# Patient Record
Sex: Male | Born: 1950 | Race: White | Hispanic: No | Marital: Married | Smoking: Never smoker
Health system: Southern US, Community
[De-identification: ages and names within clinical notes are randomized; demographics above are authoritative.]

## PROBLEM LIST (undated history)

## (undated) DIAGNOSIS — F32A Depression, unspecified: Secondary | ICD-10-CM

## (undated) DIAGNOSIS — Z87442 Personal history of urinary calculi: Secondary | ICD-10-CM

## (undated) DIAGNOSIS — I639 Cerebral infarction, unspecified: Secondary | ICD-10-CM

## (undated) DIAGNOSIS — F329 Major depressive disorder, single episode, unspecified: Secondary | ICD-10-CM

## (undated) DIAGNOSIS — N200 Calculus of kidney: Secondary | ICD-10-CM

## (undated) DIAGNOSIS — G4730 Sleep apnea, unspecified: Secondary | ICD-10-CM

## (undated) HISTORY — PX: CYSTOSCOPY: SUR368

## (undated) HISTORY — PX: LITHOTRIPSY: SUR834

---

## 2004-03-18 ENCOUNTER — Other Ambulatory Visit: Payer: Self-pay

## 2005-09-01 ENCOUNTER — Ambulatory Visit: Payer: Self-pay | Admitting: Unknown Physician Specialty

## 2007-07-05 ENCOUNTER — Ambulatory Visit: Payer: Self-pay | Admitting: Internal Medicine

## 2008-04-10 ENCOUNTER — Observation Stay: Payer: Self-pay | Admitting: Internal Medicine

## 2009-06-25 IMAGING — CT CT STONE STUDY
1 of 2 series · 16 of 32 positions shown, 20 images · non-contrast
Comparison: none

REASON FOR EXAM: flank pain l
COMMENTS:

[Series 2: stone · axial · 0.68mm/px · z∈[-304,+128]mm · 16 of 156 slices shown, 20 images]
[im 6/156  soft-tissue]
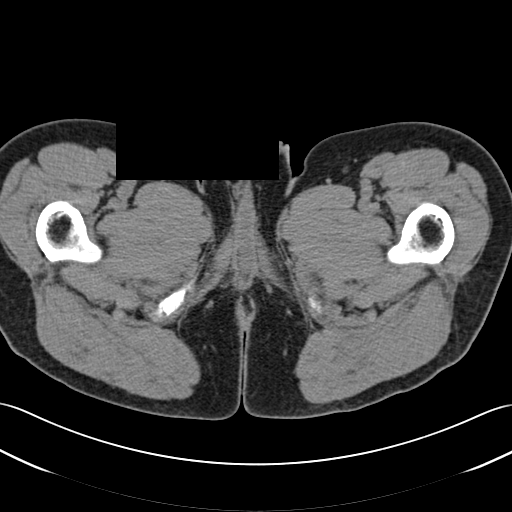
[im 6/156  bone]
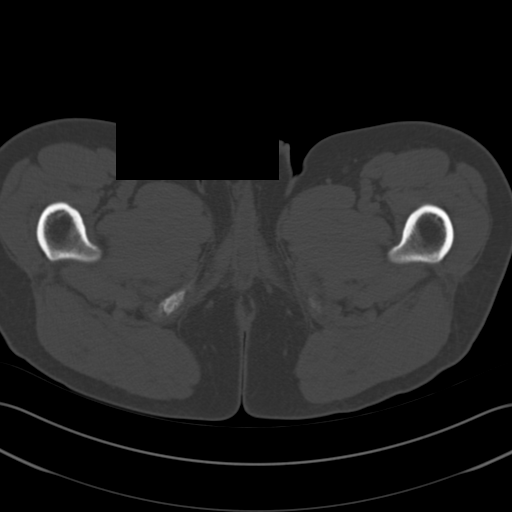
[im 18/156  soft-tissue]
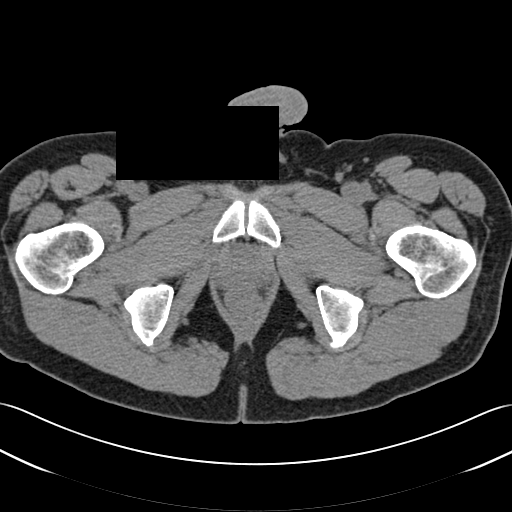
[im 30/156  soft-tissue]
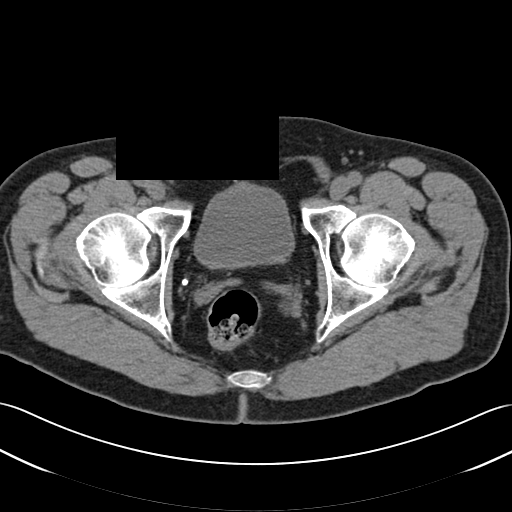
[im 42/156  soft-tissue]
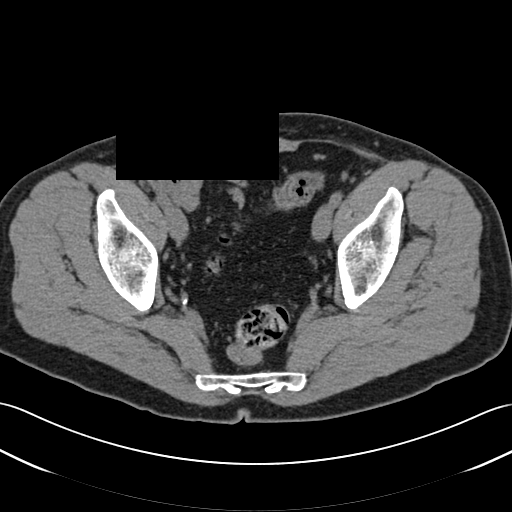
[im 54/156  soft-tissue]
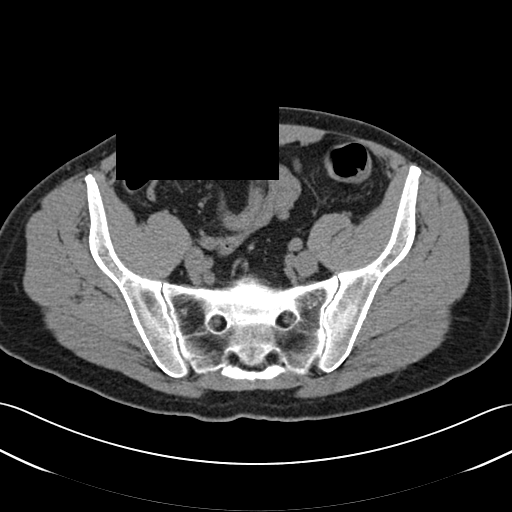
[im 60/156  soft-tissue]
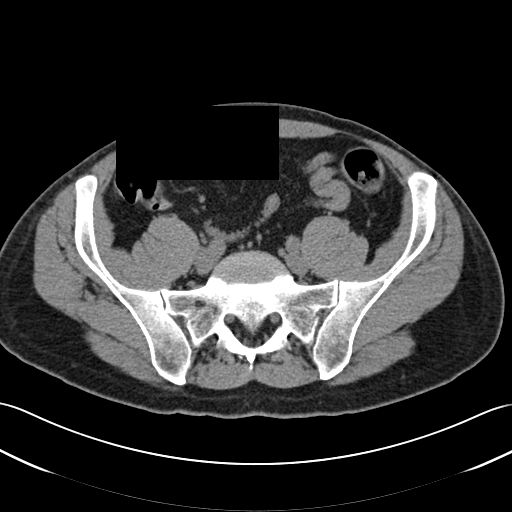
[im 72/156  soft-tissue]
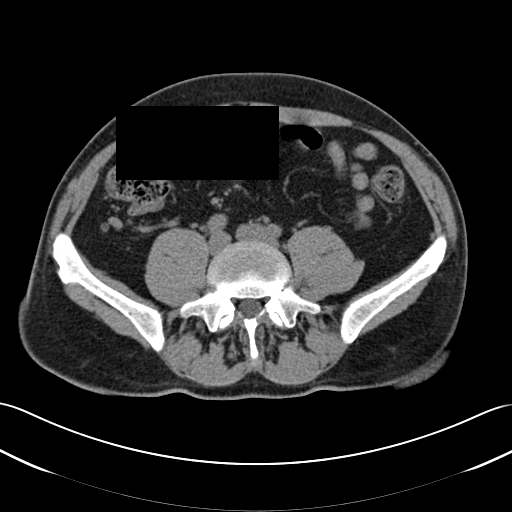
[im 84/156  soft-tissue]
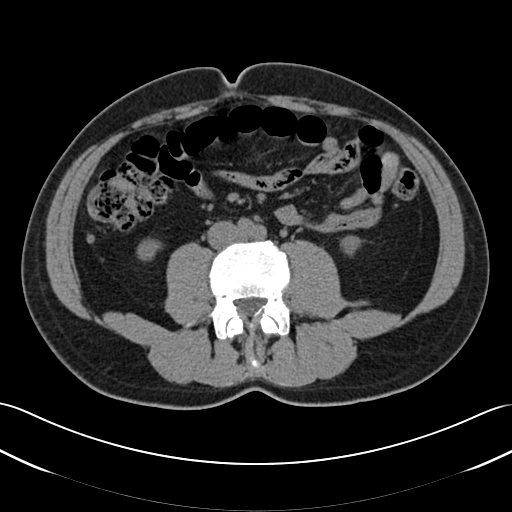
[im 96/156  soft-tissue]
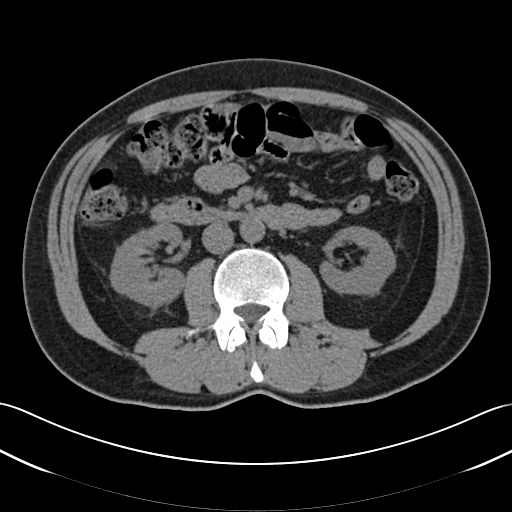
[im 96/156  bone]
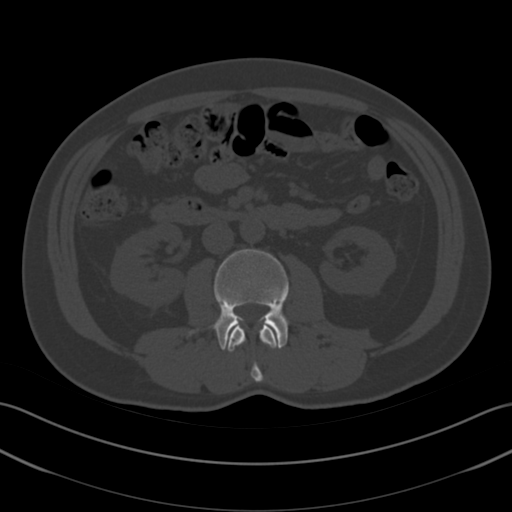
[im 102/156  soft-tissue]
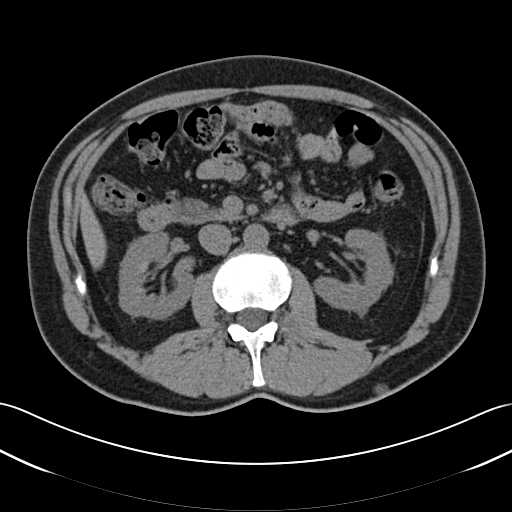
[im 114/156  soft-tissue]
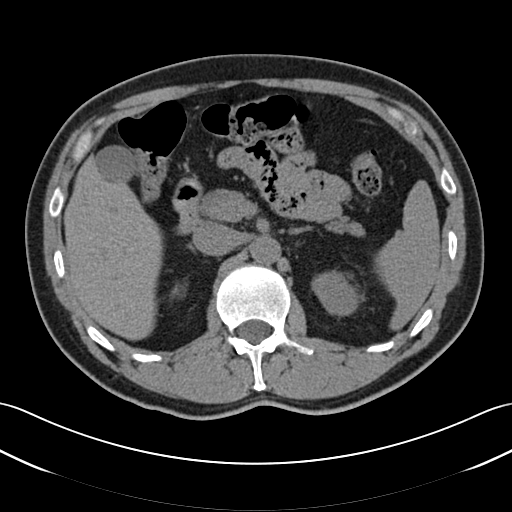
[im 126/156  soft-tissue]
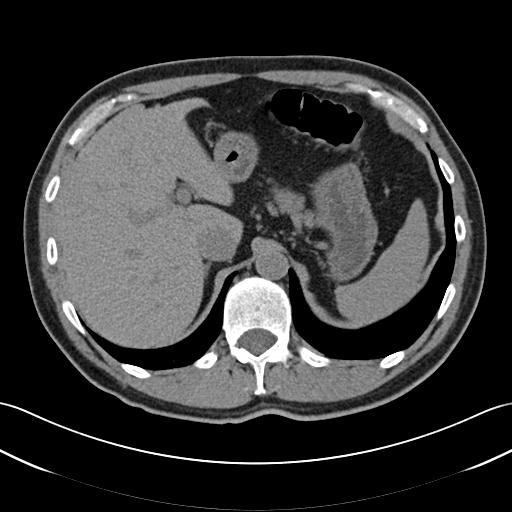
[im 132/156  lung]
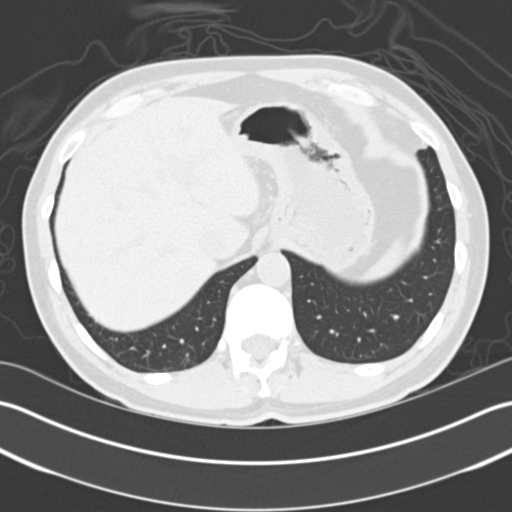
[im 138/156  soft-tissue]
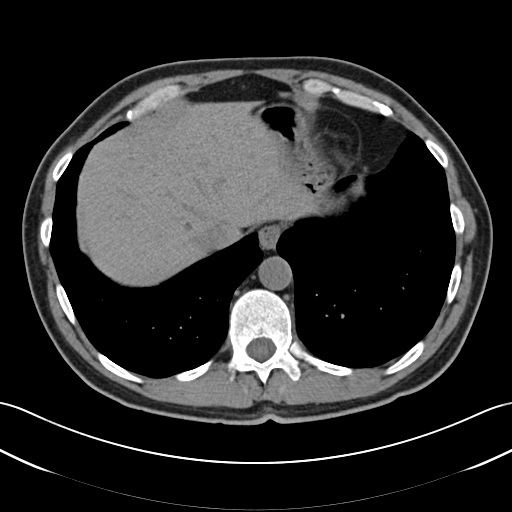
[im 138/156  lung]
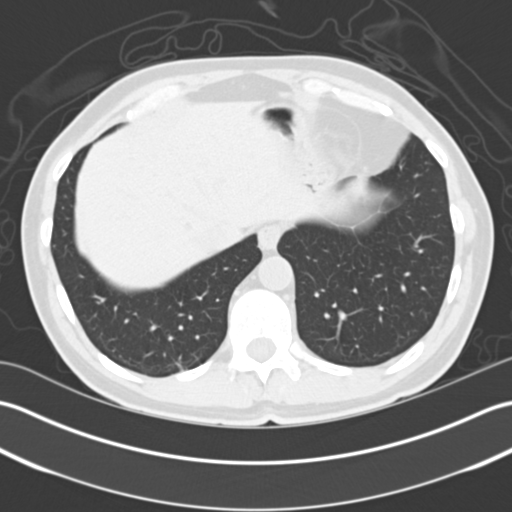
[im 144/156  lung]
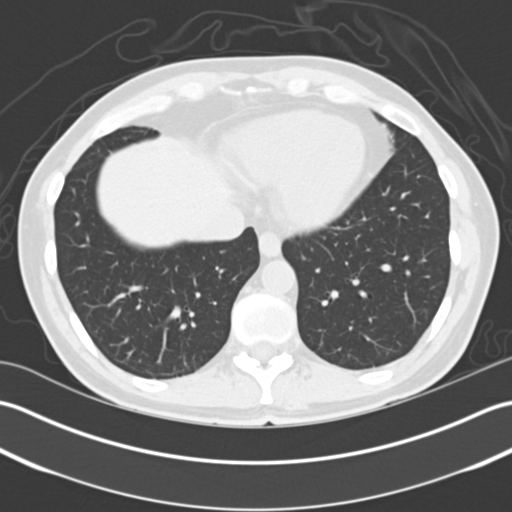
[im 150/156  soft-tissue]
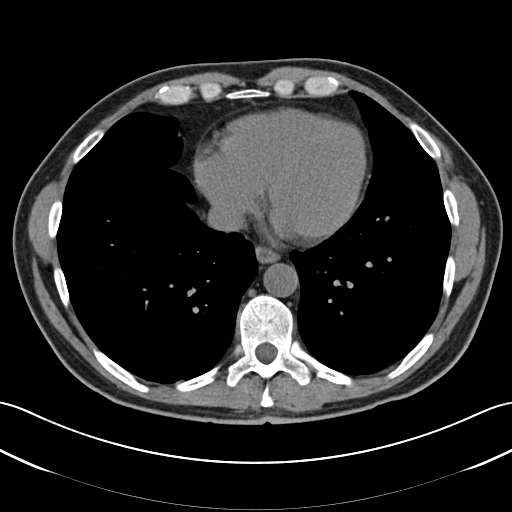
[im 150/156  lung]
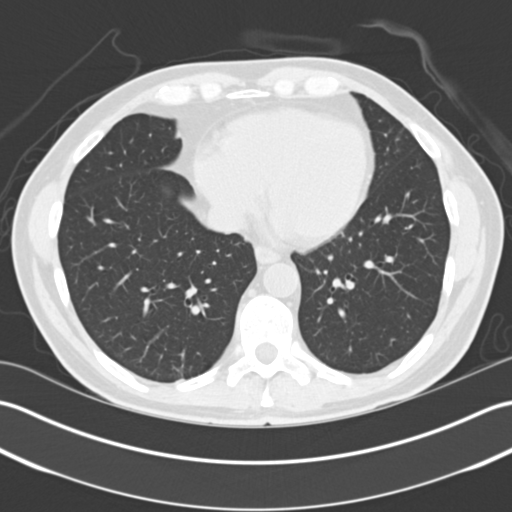

[16 of 32 positions shown; findings below may reference images not displayed]

PROCEDURE:     CT  - CT ABDOMEN /PELVIS WO (STONE)  - April 10, 2008 [DATE]

RESULT:     Noncontrast emergent CT of the abdomen and pelvis is performed
in the standard fashion. There is no previous examination for comparison.

The lung bases are clear.

The kidneys demonstrate no obstructive change. No urinary calculi are
evident. The upper abdominal viscera appear to be normal. The bowel shows no
abnormal distention. There is no inflammatory change. The appendix appears
normal. There is no ascites or free air. The abdominal aorta is normal in
caliber.
IMPRESSION: 1. No urinary obstruction or stones.
2. No other significant abnormality.

## 2010-01-15 ENCOUNTER — Emergency Department: Payer: Self-pay | Admitting: Emergency Medicine

## 2011-10-30 ENCOUNTER — Ambulatory Visit: Payer: Self-pay | Admitting: Unknown Physician Specialty

## 2011-11-28 ENCOUNTER — Ambulatory Visit: Payer: Self-pay | Admitting: Unknown Physician Specialty

## 2015-09-03 DIAGNOSIS — I639 Cerebral infarction, unspecified: Secondary | ICD-10-CM | POA: Insufficient documentation

## 2015-11-07 DIAGNOSIS — N200 Calculus of kidney: Secondary | ICD-10-CM | POA: Insufficient documentation

## 2017-03-06 ENCOUNTER — Encounter: Payer: Self-pay | Admitting: Emergency Medicine

## 2017-03-06 ENCOUNTER — Emergency Department
Admission: EM | Admit: 2017-03-06 | Discharge: 2017-03-06 | Disposition: A | Discharge status: Home / Self Care | Payer: Medicare Other | Attending: Emergency Medicine | Admitting: Emergency Medicine

## 2017-03-06 DIAGNOSIS — W268XXA Contact with other sharp object(s), not elsewhere classified, initial encounter: Secondary | ICD-10-CM | POA: Diagnosis not present

## 2017-03-06 DIAGNOSIS — Y939 Activity, unspecified: Secondary | ICD-10-CM | POA: Insufficient documentation

## 2017-03-06 DIAGNOSIS — Y999 Unspecified external cause status: Secondary | ICD-10-CM | POA: Insufficient documentation

## 2017-03-06 DIAGNOSIS — Z79899 Other long term (current) drug therapy: Secondary | ICD-10-CM | POA: Insufficient documentation

## 2017-03-06 DIAGNOSIS — Y929 Unspecified place or not applicable: Secondary | ICD-10-CM | POA: Insufficient documentation

## 2017-03-06 DIAGNOSIS — Z8673 Personal history of transient ischemic attack (TIA), and cerebral infarction without residual deficits: Secondary | ICD-10-CM | POA: Insufficient documentation

## 2017-03-06 DIAGNOSIS — S41112A Laceration without foreign body of left upper arm, initial encounter: Secondary | ICD-10-CM | POA: Insufficient documentation

## 2017-03-06 DIAGNOSIS — Z7982 Long term (current) use of aspirin: Secondary | ICD-10-CM | POA: Diagnosis not present

## 2017-03-06 HISTORY — DX: Calculus of kidney: N20.0

## 2017-03-06 HISTORY — DX: Major depressive disorder, single episode, unspecified: F32.9

## 2017-03-06 HISTORY — DX: Depression, unspecified: F32.A

## 2017-03-06 HISTORY — DX: Cerebral infarction, unspecified: I63.9

## 2017-03-06 MED ORDER — LIDOCAINE HCL (PF) 1 % IJ SOLN
5.0000 mL | Freq: Once | INTRAMUSCULAR | Status: AC
  Administered 2017-03-06: 5 mL via INTRADERMAL
  Filled 2017-03-06: qty 5

## 2017-03-06 MED ORDER — CEPHALEXIN 500 MG PO CAPS: 500.0000 mg | ORAL_CAPSULE | Freq: Four times a day (QID) | ORAL | 0 refills | Status: AC

## 2017-03-06 MED ORDER — TETANUS-DIPHTH-ACELL PERTUSSIS 5-2.5-18.5 LF-MCG/0.5 IM SUSP
0.5000 mL | Freq: Once | INTRAMUSCULAR | Status: AC
  Administered 2017-03-06: 0.5 mL via INTRAMUSCULAR
  Filled 2017-03-06: qty 0.5

## 2017-03-06 NOTE — ED Notes (Signed)
Pt discharged to home.  Family member driving.  Discharge instructions reviewed.  Verbalized understanding.  No questions or concerns at this time.  Teach back verified.  Pt in NAD.  No items left in ED.   

## 2017-03-06 NOTE — ED Notes (Signed)
Wound cleaned and dressed by this RN.  Pt in NAD.

## 2017-03-06 NOTE — ED Provider Notes (Signed)
[facility] Emergency Department Provider Note  ____________________________________________  Time seen: Approximately 12:45 PM  I have reviewed the triage vital signs and the nursing notes.   HISTORY  Chief Complaint Laceration    HPI Thomas Olson is a 66 y.o. male that presents to emergency department with left forearm laceration. Patient cut himself on a piece of aluminum. He is able to move his hand fully. He is not concerned for any metal in his forearm. He does not recall last tetanus shot.No additional injuries. No numbness, tingling.   Past Medical History:  Diagnosis Date  . CVA (cerebral vascular accident) (HCC)   . Depression   . Kidney stones     There are no active problems to display for this patient.   History reviewed. No pertinent surgical history.  Prior to Admission medications   Medication Sig Start Date End Date Taking? Authorizing Provider  aspirin EC 81 MG tablet Take 81 mg by mouth daily.   Yes [provider]  atorvastatin (LIPITOR) 20 MG tablet Take 20 mg by mouth daily.   Yes [provider]  citalopram (CELEXA) 20 MG tablet Take 20 mg by mouth daily.   Yes [provider]  cephALEXin (KEFLEX) 500 MG capsule Take 1 capsule (500 mg total) by mouth 4 (four) times daily. 03/06/17 03/16/17  Enid Derry, PA-C    Allergies Dilaudid [hydromorphone hcl]  No family history on file.  Social History Social History  Substance Use Topics  . Smoking status: Never Smoker  . Smokeless tobacco: Never Used  . Alcohol use No     Review of Systems  Constitutional: No fever/chills Cardiovascular: No chest pain. Respiratory: No SOB. Gastrointestinal: No nausea, no vomiting.  Skin: Negative for rash, ecchymosis.   ____________________________________________   PHYSICAL EXAM:  VITAL SIGNS: ED Triage Vitals  Enc Vitals Group     BP 03/06/17 1128 124/69     Pulse Rate 03/06/17 1128 71   Resp 03/06/17 1128 16     Temp 03/06/17 1128 98.5 F (36.9 C)     Temp Source 03/06/17 1128 Oral     SpO2 03/06/17 1128 98 %     Weight 03/06/17 1127 165 lb (74.8 kg)     Height 03/06/17 1127  (1.702 m)     Head Circumference --      Peak Flow --      Pain Score 03/06/17 1127 6     Pain Loc --      Pain Edu? --      Excl. in GC? --      Constitutional: Alert and oriented. Well appearing and in no acute distress. Eyes: Conjunctivae are normal. PERRL. EOMI. Head: Atraumatic. ENT:      Ears:      Nose: No congestion/rhinnorhea.      Mouth/Throat: Mucous membranes are moist.  Neck: No stridor. Cardiovascular: Normal rate, regular rhythm.  Good peripheral circulation. 2+ radial pulses Respiratory: Normal respiratory effort without tachypnea or retractions. Lungs CTAB. Good air entry to the bases with no decreased or absent breath sounds. Musculoskeletal: Full range of motion to all extremities. No gross deformities appreciated. Full range of motion and strength of left hand. Neurologic:  Normal speech and language. No gross focal neurologic deficits are appreciated.  Skin:  Skin is warm, dry. 1 cm x 1cm V shaped laceration with jagged edges to left forearm.    ____________________________________________   LABS (all labs ordered are listed, but only abnormal results  are displayed)  Labs Reviewed - No data to display ____________________________________________  EKG   ____________________________________________  RADIOLOGY   No results found.  ____________________________________________    PROCEDURES  Procedure(s) performed:    Procedures    Medications  Tdap (BOOSTRIX) injection 0.5 mL (0.5 mLs Intramuscular Given 03/06/17 1200)  lidocaine (PF) (XYLOCAINE) 1 % injection 5 mL (5 mLs Intradermal Given 03/06/17 1200)     ____________________________________________   INITIAL IMPRESSION / ASSESSMENT AND PLAN / ED COURSE  Pertinent labs & imaging  results that were available during my care of the patient were reviewed by me and considered in my medical decision making (see chart for details).  Review of the [place] CSRS was performed in accordance of the [facility] prior to dispensing any controlled drugs.     Patient's diagnosis is consistent with laceration. Vital signs and exam are reassuring. Laceration was repaired with stitches. Wound was dressed. Tetanus shot was updated. Patient will be discharged home with prescriptions for Keflex. Patient is to follow up with PCP as directed. Patient is given ED precautions to return to the ED for any worsening or new symptoms.     ____________________________________________  FINAL CLINICAL IMPRESSION(S) / ED DIAGNOSES  Final diagnoses:  Laceration of left upper extremity, initial encounter      NEW MEDICATIONS STARTED DURING THIS VISIT:  Discharge Medication List as of 03/06/2017 12:51 PM    START taking these medications   Details  cephALEXin (KEFLEX) 500 MG capsule Take 1 capsule (500 mg total) by mouth 4 (four) times daily., Starting Fri 03/06/2017, Until Mon 03/16/2017, Print            This chart was dictated using voice recognition software/Dragon. Despite best efforts to proofread, errors can occur which can change the meaning. Any change was purely unintentional.    Enid Derry, PA-C 03/06/17 1630    Emily Filbert, MD 03/07/17 1452

## 2017-03-06 NOTE — ED Notes (Signed)
See triage note  Presents with laceration noted to left forearm   States he was cut by a piece of aluminum   Bleeding controlled on arrival

## 2017-03-06 NOTE — ED Triage Notes (Signed)
Pt reports left forearm laceration that occurred today on a piece of aluminum. Bleeding controlled.

## 2017-12-23 DIAGNOSIS — N401 Enlarged prostate with lower urinary tract symptoms: Secondary | ICD-10-CM | POA: Insufficient documentation

## 2017-12-23 DIAGNOSIS — R3914 Feeling of incomplete bladder emptying: Secondary | ICD-10-CM

## 2018-01-29 ENCOUNTER — Ambulatory Visit (INDEPENDENT_AMBULATORY_CARE_PROVIDER_SITE_OTHER): Payer: Medicare Other | Admitting: Urology

## 2018-01-29 ENCOUNTER — Encounter: Payer: Self-pay | Admitting: Urology

## 2018-01-29 VITALS — BP 121/63 | HR 70 | Ht 67.0 in | Wt 173.2 lb

## 2018-01-29 DIAGNOSIS — N138 Other obstructive and reflux uropathy: Secondary | ICD-10-CM | POA: Diagnosis not present

## 2018-01-29 DIAGNOSIS — N401 Enlarged prostate with lower urinary tract symptoms: Secondary | ICD-10-CM

## 2018-01-31 ENCOUNTER — Encounter: Payer: Self-pay | Admitting: Urology

## 2018-01-31 NOTE — H&P (View-Only) (Signed)
01/29/2018 8:39 AM   Thomas Olson 06/11/1951 [id number]  Referring provider: Cain SieveKlipstein, Christopher, MD [address] [id number] [facility] [place], [place] [address]  Chief Complaint  Patient presents with  . Advice Only    urolift    HPI: 67 year old male with severe lower urinary tract symptoms presents to discuss UroLift.  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  He currently complains of urinary frequency, intermittency, urgency, weak stream, straining to urinate and nocturia x4.  IPSS completed today was 33/35 with a quality of life rated 5/6.  He denies dysuria or gross hematuria.  He denies flank, abdominal, pelvic or scrotal pain.  He has been on tamsulosin a few years ago and saw no improvement in his symptoms.  He was placed back on tamsulosin by his PCP in early July 2019 and has seen no significant improvement in his symptoms.  There is no history of recurrent UTI.  Past urologic history remarkable for stone disease and he is status post stent placement and shockwave lithotripsy in 2017.   PMH: Past Medical History:  Diagnosis Date  . CVA (cerebral vascular accident) (HCC)   . Depression   . Kidney stones     Surgical History: No past surgical history on file.  Home Medications:  Allergies as of 01/29/2018      Reactions   Dilaudid [hydromorphone Hcl] Anaphylaxis      Medication List        Accurate as of 01/29/18 11:59 PM. Always use your most recent med list.          aspirin EC 81 MG tablet Take 81 mg by mouth daily.   atorvastatin 80 MG tablet Commonly known as:  LIPITOR   citalopram 20 MG tablet Commonly known as:  CELEXA Take 20 mg by mouth daily.   tamsulosin 0.4 MG Caps capsule Commonly known as:  FLOMAX       Allergies:  Allergies  Allergen Reactions  . Dilaudid [Hydromorphone Hcl] Anaphylaxis    Family History: No family history on file.  Social History:  reports that he has never  smoked. He has never used smokeless tobacco. He reports that he does not drink alcohol or use drugs.  ROS: UROLOGY Frequent Urination?: Yes Hard to postpone urination?: No Burning/pain with urination?: No Get up at night to urinate?: Yes Leakage of urine?: No Urine stream starts and stops?: Yes Trouble starting stream?: No Do you have to strain to urinate?: No Blood in urine?: No Urinary tract infection?: No Sexually transmitted disease?: No Injury to kidneys or bladder?: No Painful intercourse?: No Weak stream?: No Erection problems?: No Penile pain?: No  Gastrointestinal Nausea?: No Vomiting?: No Indigestion/heartburn?: No Diarrhea?: No Constipation?: No  Constitutional Fever: No Night sweats?: No Weight loss?: No Fatigue?: No  Skin Skin rash/lesions?: No Itching?: No  Eyes Blurred vision?: No Double vision?: No  Ears/Nose/Throat Sore throat?: No Sinus problems?: No  Hematologic/Lymphatic Easy bruising?: No  Cardiovascular Leg swelling?: No Chest pain?: No  Respiratory Cough?: No Shortness of breath?: No  Endocrine Excessive thirst?: No  Musculoskeletal Back pain?: No Joint pain?: No  Neurological Headaches?: No Dizziness?: No  Psychologic Depression?: No Anxiety?: No  Physical Exam: BP 121/63 (BP Location: Left Arm, Patient Position: Sitting, Cuff Size: Normal)   Pulse 70   Ht 5\' 7"  (1.702 m)   Wt 173 lb 3.2 oz (78.6 kg)   BMI 27.13 kg/m   Constitutional:  Alert and  oriented, No acute distress. HEENT: [place] AT, moist mucus membranes.  Trachea midline, no masses. Cardiovascular: No clubbing, cyanosis, or edema.  RRR Respiratory: Normal respiratory effort, no increased work of breathing.  Lungs clear GI: Abdomen is soft, nontender, nondistended, no abdominal masses GU: No CVA tenderness Lymph: No cervical or inguinal lymphadenopathy. Skin: No rashes, bruises or suspicious lesions. Neurologic: Grossly intact, no focal deficits,  moving all 4 extremities. Psychiatric: Normal mood and affect.   Assessment & Plan:   67 year old male with severe lower urinary tract symptoms secondary to BPH.  He has had no improvement on tamsulosin.  Other medical management options were discussed including a 5-ARI however he was informed this would take at least 6 months to determine efficacy.  We discussed TURP and PVP.  Minimally invasive options of UroLift and water vapor ablation were discussed.  He is primarily interested in UroLift.  The procedure was discussed in detail.  The most common side effects of urinary frequency, urgency and dysuria for several weeks after the treatment were discussed.  Will need to schedule cystoscopy and TRUS of the prostate for volume to determine if he is a candidate for the procedure.    Riki Altes, MD  [facility] [address], [address] [place], [place] [address] [phone]

## 2018-01-31 NOTE — Progress Notes (Signed)
01/29/2018 8:39 AM   Thomas Olson 06/11/1951 [id number]  Referring provider: Cain SieveKlipstein, Christopher, MD [address] [id number] [facility] [place], [place] [address]  Chief Complaint  Patient presents with  . Advice Only    urolift    HPI: 67 year old male with severe lower urinary tract symptoms presents to discuss UroLift.  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  He currently complains of urinary frequency, intermittency, urgency, weak stream, straining to urinate and nocturia x4.  IPSS completed today was 33/35 with a quality of life rated 5/6.  He denies dysuria or gross hematuria.  He denies flank, abdominal, pelvic or scrotal pain.  He has been on tamsulosin a few years ago and saw no improvement in his symptoms.  He was placed back on tamsulosin by his PCP in early July 2019 and has seen no significant improvement in his symptoms.  There is no history of recurrent UTI.  Past urologic history remarkable for stone disease and he is status post stent placement and shockwave lithotripsy in 2017.   PMH: Past Medical History:  Diagnosis Date  . CVA (cerebral vascular accident) (HCC)   . Depression   . Kidney stones     Surgical History: No past surgical history on file.  Home Medications:  Allergies as of 01/29/2018      Reactions   Dilaudid [hydromorphone Hcl] Anaphylaxis      Medication List        Accurate as of 01/29/18 11:59 PM. Always use your most recent med list.          aspirin EC 81 MG tablet Take 81 mg by mouth daily.   atorvastatin 80 MG tablet Commonly known as:  LIPITOR   citalopram 20 MG tablet Commonly known as:  CELEXA Take 20 mg by mouth daily.   tamsulosin 0.4 MG Caps capsule Commonly known as:  FLOMAX       Allergies:  Allergies  Allergen Reactions  . Dilaudid [Hydromorphone Hcl] Anaphylaxis    Family History: No family history on file.  Social History:  reports that he has never  smoked. He has never used smokeless tobacco. He reports that he does not drink alcohol or use drugs.  ROS: UROLOGY Frequent Urination?: Yes Hard to postpone urination?: No Burning/pain with urination?: No Get up at night to urinate?: Yes Leakage of urine?: No Urine stream starts and stops?: Yes Trouble starting stream?: No Do you have to strain to urinate?: No Blood in urine?: No Urinary tract infection?: No Sexually transmitted disease?: No Injury to kidneys or bladder?: No Painful intercourse?: No Weak stream?: No Erection problems?: No Penile pain?: No  Gastrointestinal Nausea?: No Vomiting?: No Indigestion/heartburn?: No Diarrhea?: No Constipation?: No  Constitutional Fever: No Night sweats?: No Weight loss?: No Fatigue?: No  Skin Skin rash/lesions?: No Itching?: No  Eyes Blurred vision?: No Double vision?: No  Ears/Nose/Throat Sore throat?: No Sinus problems?: No  Hematologic/Lymphatic Easy bruising?: No  Cardiovascular Leg swelling?: No Chest pain?: No  Respiratory Cough?: No Shortness of breath?: No  Endocrine Excessive thirst?: No  Musculoskeletal Back pain?: No Joint pain?: No  Neurological Headaches?: No Dizziness?: No  Psychologic Depression?: No Anxiety?: No  Physical Exam: BP 121/63 (BP Location: Left Arm, Patient Position: Sitting, Cuff Size: Normal)   Pulse 70   Ht 5\' 7"  (1.702 m)   Wt 173 lb 3.2 oz (78.6 kg)   BMI 27.13 kg/m   Constitutional:  Alert and  oriented, No acute distress. HEENT: [place] AT, moist mucus membranes.  Trachea midline, no masses. Cardiovascular: No clubbing, cyanosis, or edema.  RRR Respiratory: Normal respiratory effort, no increased work of breathing.  Lungs clear GI: Abdomen is soft, nontender, nondistended, no abdominal masses GU: No CVA tenderness Lymph: No cervical or inguinal lymphadenopathy. Skin: No rashes, bruises or suspicious lesions. Neurologic: Grossly intact, no focal deficits,  moving all 4 extremities. Psychiatric: Normal mood and affect.   Assessment & Plan:   67 year old male with severe lower urinary tract symptoms secondary to BPH.  He has had no improvement on tamsulosin.  Other medical management options were discussed including a 5-ARI however he was informed this would take at least 6 months to determine efficacy.  We discussed TURP and PVP.  Minimally invasive options of UroLift and water vapor ablation were discussed.  He is primarily interested in UroLift.  The procedure was discussed in detail.  The most common side effects of urinary frequency, urgency and dysuria for several weeks after the treatment were discussed.  Will need to schedule cystoscopy and TRUS of the prostate for volume to determine if he is a candidate for the procedure.    Riki Altes, MD  [facility] [address] [place], [place] [address] [phone]

## 2018-02-01 ENCOUNTER — Encounter: Payer: Self-pay | Admitting: Radiology

## 2018-02-02 ENCOUNTER — Ambulatory Visit: Payer: Medicare Other | Admitting: Urology

## 2018-02-02 ENCOUNTER — Encounter: Payer: Self-pay | Admitting: Urology

## 2018-02-02 ENCOUNTER — Other Ambulatory Visit: Payer: Self-pay | Admitting: Radiology

## 2018-02-02 VITALS — BP 108/68 | HR 59 | Ht 67.0 in | Wt 170.8 lb

## 2018-02-02 DIAGNOSIS — N401 Enlarged prostate with lower urinary tract symptoms: Secondary | ICD-10-CM | POA: Diagnosis not present

## 2018-02-02 DIAGNOSIS — N210 Calculus in bladder: Secondary | ICD-10-CM

## 2018-02-02 DIAGNOSIS — N138 Other obstructive and reflux uropathy: Secondary | ICD-10-CM

## 2018-02-02 LAB — URINALYSIS, COMPLETE
Bilirubin, UA: NEGATIVE
GLUCOSE, UA: NEGATIVE
Ketones, UA: NEGATIVE
Leukocytes, UA: NEGATIVE
Nitrite, UA: NEGATIVE
PH UA: 6.5 (ref 5.0–7.5)
PROTEIN UA: NEGATIVE
Specific Gravity, UA: 1.025 (ref 1.005–1.030)
Urobilinogen, Ur: 0.2 mg/dL (ref 0.2–1.0)

## 2018-02-02 LAB — MICROSCOPIC EXAMINATION
Epithelial Cells (non renal): NONE SEEN /hpf (ref 0–10)
WBC, UA: NONE SEEN /hpf (ref 0–5)

## 2018-02-02 MED ORDER — CIPROFLOXACIN HCL 500 MG PO TABS: 500.0000 mg | ORAL_TABLET | Freq: Once | ORAL | Status: DC

## 2018-02-02 MED ORDER — LIDOCAINE HCL URETHRAL/MUCOSAL 2 % EX GEL: 1.0000 "application " | Freq: Once | CUTANEOUS | Status: DC

## 2018-02-02 NOTE — Progress Notes (Signed)
   02/02/18  CC:  Chief Complaint  Patient presents with  . Cysto    HPI: 67 year old male with severe lower urinary tract symptoms who desires UroLift.  He presents today for cystoscopy and TRUS.  Blood pressure 108/68, pulse (!) 59, height 5\' 7"  (1.702 m), weight 170 lb 12.8 oz (77.5 kg). NED. A&Ox3.   No respiratory distress   Abd soft, NT, ND Normal phallus with bilateral descended testicles Prostate 40 g, smooth without nodules  Cystoscopy Procedure Note  Patient identification was confirmed, informed consent was obtained, and patient was prepped using Betadine solution.  Lidocaine jelly was administered per urethral meatus.    Preoperative abx where received prior to procedure.     Pre-Procedure: - Inspection reveals a normal caliber ureteral meatus.  Procedure: The flexible cystoscope was introduced without difficulty - No urethral strictures/lesions are present. - Moderate lateral lobe enlargement prostate  - Moderate elevation bladder neck - Bilateral ureteral orifices identified - Bladder mucosa  reveals no ulcers, tumors, or lesions -3 bladder stones each measuring approximately 1 cm -Mild trabeculation  Retroflexion shows no intravesical median lobe   Post-Procedure: - Patient tolerated the procedure well  TRUS: Prostate volume calculated 41 cc.  No intravesical median lobe but elevation of bladder base.   Height 35 mm Width 50 mm Length 40 mm   Assessment/ Plan: Based on cystoscopy and prostate ultrasound he would be a candidate for UroLift.  He was incidentally noted to have bladder calculi.  He presides to proceed with UroLift and will schedule cystolitholapaxy.  The procedures were discussed in detail including potential risks of bleeding, infection, urinary retention and persistent voiding symptoms.  He indicated all questions were answered and desires to proceed.   Irineo AxonScott Stoioff, MD

## 2018-02-08 ENCOUNTER — Encounter
Admission: RE | Admit: 2018-02-08 | Discharge: 2018-02-08 | Disposition: A | Discharge status: Home / Self Care | Payer: Medicare Other | Source: Ambulatory Visit | Attending: Urology | Admitting: Urology

## 2018-02-08 ENCOUNTER — Other Ambulatory Visit: Payer: Self-pay

## 2018-02-08 DIAGNOSIS — Z8673 Personal history of transient ischemic attack (TIA), and cerebral infarction without residual deficits: Secondary | ICD-10-CM | POA: Insufficient documentation

## 2018-02-08 DIAGNOSIS — Z01812 Encounter for preprocedural laboratory examination: Secondary | ICD-10-CM | POA: Diagnosis present

## 2018-02-08 DIAGNOSIS — R9431 Abnormal electrocardiogram [ECG] [EKG]: Secondary | ICD-10-CM | POA: Insufficient documentation

## 2018-02-08 DIAGNOSIS — Z01810 Encounter for preprocedural cardiovascular examination: Secondary | ICD-10-CM | POA: Diagnosis present

## 2018-02-08 HISTORY — DX: Sleep apnea, unspecified: G47.30

## 2018-02-08 LAB — BASIC METABOLIC PANEL
Anion gap: 5 (ref 5–15)
BUN: 22 mg/dL (ref 8–23)
CALCIUM: 9.4 mg/dL (ref 8.9–10.3)
CO2: 31 mmol/L (ref 22–32)
Chloride: 107 mmol/L (ref 98–111)
Creatinine, Ser: 0.87 mg/dL (ref 0.61–1.24)
GFR calc Af Amer: >60 mL/min (ref >60)
GLUCOSE: 118 mg/dL — AB (ref 70–99)
POTASSIUM: 4.6 mmol/L (ref 3.5–5.1)
Sodium: 143 mmol/L (ref 135–145)

## 2018-02-08 LAB — CBC
HCT: 39.9 % — ABNORMAL LOW (ref 40.0–52.0)
Hemoglobin: 13.8 g/dL (ref 13.0–18.0)
MCH: 31.8 pg (ref 26.0–34.0)
MCHC: 34.6 g/dL (ref 32.0–36.0)
MCV: 92 fL (ref 80.0–100.0)
PLATELETS: 243 10*3/uL (ref 150–440)
RBC: 4.34 MIL/uL — ABNORMAL LOW (ref 4.40–5.90)
RDW: 13.6 % (ref 11.5–14.5)
WBC: 7 10*3/uL (ref 3.8–10.6)

## 2018-02-08 LAB — PSA: Prostatic Specific Antigen: 3.42 ng/mL (ref 0.00–4.00)

## 2018-02-08 NOTE — Patient Instructions (Signed)
  Your procedure is scheduled on: Friday February 12, 2018 Report to Same Day Surgery 2nd floor medical mall (Medical Mall Entrance-take elevator on left to 2nd floor.  Check in with surgery information desk.) To find out your arrival time please call [phone] between 1PM - 3PM on Thursday February 11, 2018  Remember: Instructions that are not followed completely may result in serious medical risk, up to and including death, or upon the discretion of your surgeon and anesthesiologist your surgery may need to be rescheduled.    _x___ 1. Do not eat food (including mints, candies, chewing gum) after midnight the night before your procedure. You may drink clear liquids up to 2 hours before you are scheduled to arrive at the hospital for your procedure.  Do not drink clear liquids within 2 hours of your scheduled arrival to the hospital.  Clear liquids include  --Water or Apple juice without pulp  --Clear carbohydrate beverage such as Gatorade  --Black Coffee or Clear Tea (No milk, no creamers, do not add anything to the coffee or tea)    __x__ 2. No Alcohol for 24 hours before or after surgery.   __x__ 3. No Smoking or e-cigarettes for 24 prior to surgery.  Do not use any chewable tobacco products for at least 6 hour prior to surgery   __x__ 4. Notify your doctor if there is any change in your medical condition (cold, fever, infections).   __x__ 5. On the morning of surgery brush your teeth with toothpaste and water.  You may rinse your mouth with mouth wash if you wish.  Do not swallow any toothpaste or mouthwash.  Please read over the following fact sheets that you were given:   [facility] Preparing for [facility] and or MRSA Information    Do not wear jewelry.  Do not wear lotions, powders, deodorant, or perfumes.   Do not shave below the face/neck 48 hours prior to surgery.   Do not bring valuables to the hospital.    [facility] is not responsible for any belongings or valuables.             Contacts, dentures or bridgework may not be worn into surgery.  For patients discharged on the day of surgery, you will NOT be permitted to drive yourself home.   _x___ Take anti-hypertensive listed below, cardiac, seizure, asthma, anti-reflux and psychiatric medicines. These include:  1. Citalopram/Celexa  2. Atoravastin/Lipitor  _x___ Follow recommendations from Cardiologist, Pulmonologist or PCP regarding stopping Aspirin, Coumadin, Plavix ,Eliquis, Effient, or Pradaxa, and Pletal.  _x___ Stop Anti-inflammatories such as Advil, Aleve, Ibuprofen, Motrin, Naproxen, Naprosyn, Goodies powders or aspirin products. OK to take Tylenol and Celebrex.   _x___ NOW: Stop supplements until after surgery.  But may continue Vitamin D, Vitamin B, and multivitamin.

## 2018-02-09 LAB — URINE CULTURE: CULTURE: NO GROWTH

## 2018-02-11 MED ORDER — CEFAZOLIN SODIUM-DEXTROSE 2-4 GM/100ML-% IV SOLN
2.0000 g | INTRAVENOUS | Status: AC
  Administered 2018-02-12: 2 g via INTRAVENOUS

## 2018-02-12 ENCOUNTER — Encounter: Payer: Self-pay | Admitting: *Deleted

## 2018-02-12 ENCOUNTER — Other Ambulatory Visit: Payer: Self-pay

## 2018-02-12 ENCOUNTER — Ambulatory Visit: Payer: Medicare Other | Admitting: Certified Registered"

## 2018-02-12 ENCOUNTER — Ambulatory Visit
Admission: RE | Admit: 2018-02-12 | Discharge: 2018-02-12 | Disposition: A | Discharge status: Home / Self Care | Payer: Medicare Other | Source: Ambulatory Visit | Attending: Urology | Admitting: Urology

## 2018-02-12 ENCOUNTER — Encounter
Admission: RE | Disposition: A | Discharge status: Home / Self Care | Payer: Self-pay | Source: Ambulatory Visit | Attending: Urology

## 2018-02-12 DIAGNOSIS — N210 Calculus in bladder: Secondary | ICD-10-CM

## 2018-02-12 DIAGNOSIS — N138 Other obstructive and reflux uropathy: Secondary | ICD-10-CM

## 2018-02-12 DIAGNOSIS — N401 Enlarged prostate with lower urinary tract symptoms: Secondary | ICD-10-CM | POA: Insufficient documentation

## 2018-02-12 DIAGNOSIS — R350 Frequency of micturition: Secondary | ICD-10-CM | POA: Diagnosis not present

## 2018-02-12 DIAGNOSIS — Z87442 Personal history of urinary calculi: Secondary | ICD-10-CM | POA: Insufficient documentation

## 2018-02-12 DIAGNOSIS — R3916 Straining to void: Secondary | ICD-10-CM | POA: Diagnosis not present

## 2018-02-12 DIAGNOSIS — R3912 Poor urinary stream: Secondary | ICD-10-CM | POA: Diagnosis not present

## 2018-02-12 DIAGNOSIS — Z7982 Long term (current) use of aspirin: Secondary | ICD-10-CM | POA: Insufficient documentation

## 2018-02-12 DIAGNOSIS — F329 Major depressive disorder, single episode, unspecified: Secondary | ICD-10-CM | POA: Diagnosis not present

## 2018-02-12 DIAGNOSIS — Z8673 Personal history of transient ischemic attack (TIA), and cerebral infarction without residual deficits: Secondary | ICD-10-CM | POA: Diagnosis not present

## 2018-02-12 DIAGNOSIS — Z79899 Other long term (current) drug therapy: Secondary | ICD-10-CM | POA: Diagnosis not present

## 2018-02-12 DIAGNOSIS — R351 Nocturia: Secondary | ICD-10-CM | POA: Insufficient documentation

## 2018-02-12 DIAGNOSIS — R3915 Urgency of urination: Secondary | ICD-10-CM | POA: Insufficient documentation

## 2018-02-12 HISTORY — PX: CYSTOSCOPY WITH LITHOLAPAXY: SHX1425

## 2018-02-12 HISTORY — DX: Personal history of urinary calculi: Z87.442

## 2018-02-12 HISTORY — PX: CYSTOSCOPY WITH INSERTION OF UROLIFT: SHX6678

## 2018-02-12 HISTORY — PX: HOLMIUM LASER APPLICATION: SHX5852

## 2018-02-12 SURGERY — CYSTOSCOPY WITH INSERTION OF UROLIFT
Anesthesia: General | Wound class: Clean Contaminated

## 2018-02-12 MED ORDER — LIDOCAINE HCL (CARDIAC) PF 100 MG/5ML IV SOSY
PREFILLED_SYRINGE | INTRAVENOUS | Status: DC | PRN
  Administered 2018-02-12: 100 mg via INTRAVENOUS

## 2018-02-12 MED ORDER — ONDANSETRON HCL 4 MG/2ML IJ SOLN
INTRAMUSCULAR | Status: DC | PRN
  Administered 2018-02-12: 4 mg via INTRAVENOUS

## 2018-02-12 MED ORDER — FENTANYL CITRATE (PF) 100 MCG/2ML IJ SOLN
INTRAMUSCULAR | Status: AC
  Filled 2018-02-12: qty 2

## 2018-02-12 MED ORDER — LACTATED RINGERS IV SOLN
INTRAVENOUS | Status: DC
  Administered 2018-02-12: 08:00:00 via INTRAVENOUS

## 2018-02-12 MED ORDER — LIDOCAINE HCL (PF) 2 % IJ SOLN
INTRAMUSCULAR | Status: AC
  Filled 2018-02-12: qty 10

## 2018-02-12 MED ORDER — FAMOTIDINE 20 MG PO TABS
20.0000 mg | ORAL_TABLET | Freq: Once | ORAL | Status: AC
  Administered 2018-02-12: 20 mg via ORAL

## 2018-02-12 MED ORDER — PROPOFOL 10 MG/ML IV BOLUS
INTRAVENOUS | Status: AC
  Filled 2018-02-12: qty 20

## 2018-02-12 MED ORDER — FAMOTIDINE 20 MG PO TABS
ORAL_TABLET | ORAL | Status: AC
  Filled 2018-02-12: qty 1

## 2018-02-12 MED ORDER — EPHEDRINE SULFATE 50 MG/ML IJ SOLN
INTRAMUSCULAR | Status: DC | PRN
  Administered 2018-02-12: 5 mg via INTRAVENOUS
  Administered 2018-02-12: 10 mg via INTRAVENOUS
  Administered 2018-02-12: 5 mg via INTRAVENOUS

## 2018-02-12 MED ORDER — PROPOFOL 10 MG/ML IV BOLUS
INTRAVENOUS | Status: DC | PRN
  Administered 2018-02-12: 160 mg via INTRAVENOUS

## 2018-02-12 MED ORDER — FENTANYL CITRATE (PF) 100 MCG/2ML IJ SOLN
INTRAMUSCULAR | Status: DC | PRN
  Administered 2018-02-12: 50 ug via INTRAVENOUS

## 2018-02-12 MED ORDER — MIDAZOLAM HCL 2 MG/2ML IJ SOLN
INTRAMUSCULAR | Status: AC
  Filled 2018-02-12: qty 2

## 2018-02-12 MED ORDER — CEFAZOLIN SODIUM-DEXTROSE 2-4 GM/100ML-% IV SOLN
INTRAVENOUS | Status: AC
  Filled 2018-02-12: qty 100

## 2018-02-12 MED ORDER — MIDAZOLAM HCL 2 MG/2ML IJ SOLN
INTRAMUSCULAR | Status: DC | PRN
  Administered 2018-02-12: 2 mg via INTRAVENOUS

## 2018-02-12 MED ORDER — FENTANYL CITRATE (PF) 100 MCG/2ML IJ SOLN: 25.0000 ug | INTRAMUSCULAR | Status: DC | PRN

## 2018-02-12 SURGICAL SUPPLY — 16 items
BAG DRAIN CYSTO-URO LG1000N (MISCELLANEOUS) ×3 IMPLANT
BASKET ZERO TIP 1.9FR (BASKET) IMPLANT
GLOVE BIO SURGEON STRL SZ8 (GLOVE) ×3 IMPLANT
GOWN STRL REUS W/ TWL LRG LVL3 (GOWN DISPOSABLE) ×2 IMPLANT
GOWN STRL REUS W/ TWL XL LVL3 (GOWN DISPOSABLE) ×1 IMPLANT
GOWN STRL REUS W/TWL LRG LVL3 (GOWN DISPOSABLE) ×4
GOWN STRL REUS W/TWL XL LVL3 (GOWN DISPOSABLE) ×2
KIT TURNOVER CYSTO (KITS) ×3 IMPLANT
PACK CYSTO AR (MISCELLANEOUS) ×3 IMPLANT
SET CYSTO W/LG BORE CLAMP LF (SET/KITS/TRAYS/PACK) ×3 IMPLANT
SET IRRIG Y TYPE TUR BLADDER L (SET/KITS/TRAYS/PACK) ×3 IMPLANT
SURGILUBE 2OZ TUBE FLIPTOP (MISCELLANEOUS) ×3 IMPLANT
SYRINGE IRR TOOMEY STRL 70CC (SYRINGE) ×3 IMPLANT
SYSTEM UROLIFT (Male Continence) ×15 IMPLANT
WATER STERILE IRR 1000ML POUR (IV SOLUTION) ×3 IMPLANT
WATER STERILE IRR 3000ML UROMA (IV SOLUTION) ×3 IMPLANT

## 2018-02-12 NOTE — Interval H&P Note (Signed)
History and Physical Interval Note:  02/12/2018 9:29 AM  Thomas Olson  has presented today for surgery, with the diagnosis of BPH with urinary obstruction, bladder calculi  The various methods of treatment have been discussed with the patient and family. After consideration of risks, benefits and other options for treatment, the patient has consented to  Procedure(s): CYSTOSCOPY WITH INSERTION OF UROLIFT (N/A) CYSTOSCOPY WITH LITHOLAPAXY (N/A) HOLMIUM LASER APPLICATION (N/A) as a surgical intervention .  The patient's history has been reviewed, patient examined, no change in status, stable for surgery.  I have reviewed the patient's chart and labs.  Questions were answered to the patient's satisfaction.     Scott C Stoioff

## 2018-02-12 NOTE — Anesthesia Post-op Follow-up Note (Signed)
Anesthesia QCDR form completed.        

## 2018-02-12 NOTE — Anesthesia Procedure Notes (Signed)
Procedure Name: LMA Insertion Performed by: Avira Tillison, CRNA Pre-anesthesia Checklist: Patient identified, Patient being monitored, Timeout performed, Emergency Drugs available and Suction available Patient Re-evaluated:Patient Re-evaluated prior to induction Oxygen Delivery Method: Circle system utilized Preoxygenation: Pre-oxygenation with 100% oxygen Induction Type: IV induction Ventilation: Mask ventilation without difficulty LMA: LMA inserted LMA Size: 4.5 Tube type: Oral Number of attempts: 1 Placement Confirmation: positive ETCO2 and breath sounds checked- equal and bilateral Tube secured with: Tape Dental Injury: Teeth and Oropharynx as per pre-operative assessment        

## 2018-02-12 NOTE — Transfer of Care (Signed)
Immediate Anesthesia Transfer of Care Note  Patient: Thomas Olson  Procedure(s) Performed: CYSTOSCOPY WITH INSERTION OF UROLIFT (N/A ) CYSTOSCOPY WITH LITHOLAPAXY (N/A ) HOLMIUM LASER APPLICATION (N/A )  Patient Location: PACU  Anesthesia Type:General  Level of Consciousness: awake and responds to stimulation  Airway & Oxygen Therapy: Patient Spontanous Breathing and Patient connected to face mask oxygen  Post-op Assessment: Report given to RN and Post -op Vital signs reviewed and stable  Post vital signs: Reviewed and stable  Last Vitals:  Vitals Value Taken Time  BP 147/66 02/12/2018 10:44 AM  Temp    Pulse 77 02/12/2018 10:44 AM  Resp 17 02/12/2018 10:44 AM  SpO2 100 % 02/12/2018 10:44 AM  Vitals shown include unvalidated device data.  Last Pain:  Vitals:   02/12/18 0717  TempSrc: Tympanic  PainSc: 0-No pain         Complications: No apparent anesthesia complications

## 2018-02-12 NOTE — Discharge Instructions (Signed)

## 2018-02-12 NOTE — Anesthesia Preprocedure Evaluation (Addendum)
Anesthesia Evaluation  Patient identified by MRN, date of birth, ID band Patient awake    Reviewed: Allergy & Precautions, H&P , NPO status , Patient's Chart, lab work & pertinent test results  Airway Mallampati: III  TM Distance: >3 FB Neck ROM: full    Dental  (+) Teeth Intact   Pulmonary neg pulmonary ROS, sleep apnea , neg COPD,    breath sounds clear to auscultation       Cardiovascular (-) anginanegative cardio ROS   Rhythm:regular Rate:Normal     Neuro/Psych PSYCHIATRIC DISORDERS Depression TIA (2017)negative neurological ROS  negative psych ROS   GI/Hepatic negative GI ROS, Neg liver ROS,   Endo/Other  negative endocrine ROS  Renal/GU Renal disease (kidney stones)negative Renal ROS  negative genitourinary   Musculoskeletal   Abdominal   Peds  Hematology negative hematology ROS (+)   Anesthesia Other Findings Past Medical History: No date: CVA (cerebral vascular accident) (HCC) No date: Depression No date: History of kidney stones No date: Kidney stones No date: Sleep apnea  Past Surgical History: No date: CYSTOSCOPY No date: LITHOTRIPSY  BMI    Body Mass Index:  27.05 kg/m      Reproductive/Obstetrics negative OB ROS                            Anesthesia Physical Anesthesia Plan  ASA: II  Anesthesia Plan: General and General LMA   Post-op Pain Management:    Induction:   PONV Risk Score and Plan:   Airway Management Planned: Natural Airway and Nasal Cannula  Additional Equipment:   Intra-op Plan:   Post-operative Plan:   Informed Consent: I have reviewed the patients History and Physical, chart, labs and discussed the procedure including the risks, benefits and alternatives for the proposed anesthesia with the patient or authorized representative who has indicated his/her understanding and acceptance.   Dental Advisory Given  Plan Discussed with:  Anesthesiologist, CRNA and Surgeon  Anesthesia Plan Comments:        Anesthesia Quick Evaluation

## 2018-02-12 NOTE — Op Note (Signed)
Preoperative diagnosis:  1. BPH with obstructive symptomatology 2. Bladder calculi  Postoperative diagnosis:  1. BPH with obstructive symptomatology 2. Bladder calculi  Procedure:  1. Urolift procedure, with the placement of 6 implants. 2. Cystolithalopaxy  Surgeon: Lonna CobbStoioff  Anesthesia: General with LMA  Complications: None  Drains: None  Estimated blood loss: < 5 mL  Indications:  67 year old male with BPH and severe LUTS.  The patient's symptoms have progressed, and he has requested further management.  Management options including TURP with resection/ablation of the prostate as well as Urolift were discussed.  The patient has chosen to have a Urolift procedure.  He has been instructed to the procedure as well as risks and complications which include but are not limited to infection, bleeding, and inadequate treatment with the Urolift procedure alone, anesthetic complications, among others.  He understands these and desires to proceed.  Prostate volume was 48cc by TRUS.  Findings: Using the 17 French cystoscope, urethra and bladder were inspected.  There were no urethral lesions.  Prostatic urethra was obstructed secondary to bilobar hypertrophy.  The bladder was inspected circumferentially.  This revealed normal findings.  Description of procedure: The patient was properly identified in the holding area.  He received preoperative IV antibiotics.  He was taken to the operating room where sedation was achieved by anesthesia.  He is placed in the dorsolithotomy position.  Genitalia and perineum were prepped and draped.  Proper timeout was performed.  A 75F cystoscope was inserted into the bladder. The cystoscopy bridge was replaced with a UroLift delivery device.The first treatment site was the patient's right side approximately 1.5cm distal to the bladder neck. The distal tip of the delivery device was then angled laterally approximately 20 degrees at this position to compress the  lateral lobe.  The trigger was pulled, thereby deploying a needle containing the implant through the prostate.  The needle was then retracted, allowing one end of the implant to be delivered to the capsular surface of the prostate.  The implant was then tensioned to assure capsular seating and removal of slack monofilament.  The device was then angled back toward midline and slowly advanced proximally until cystoscopic verification of the monofilament being centered in the delivery bay. The urethral end piece was then affixed to the monofilament thereby tailoring the size of the implant.  Excess filament was then severed.  The delivery device was then re-advanced into the bladder. The delivery device was then replaced with cystoscope and bridge and the implant location and opening effect was confirmed cystoscopically.  The same procedure was then repeated on the left side, 2 additional implants were delivered just proximal to the verumontanum, again one on right and one on left side of the prostate, following the same technique.    Cystoscopy was repeated and 2 additional implants were placed between the previously placed implants.  A final cystoscopy was conducted first to inspect the location and state of each implant and second, to confirm the presence of a continuous anterior channel was present through the prostatic urethra with irrigation flow turned off. Six Implants were delivered in total.   The cystoscope was removed and the anterior urethra was sounded to 28 [place] with [place] sounds.  A 26 French continuous-flow resectoscope with visual obturator was then lubricated and passed under direct vision.  5 stones were present in the bladder and the largest 2 measuring approximately 1 cm.  3 of the stones were able to be irrigated from the bladder through the  resectoscope sheath.  A laser bridge was placed in the resectoscope and a 500 m holmium laser fiber was then placed through the sheath.  At a  setting of 1 J / 10 Hz the 2 remaining stones were fragmented and half with all fragments removed via irrigation.  At the completion of the procedure there were no remaining stone fragments.  No bleeding from the bladder was noted.  The prostatic urethra showed no bleeding.  The bladder was emptied and the resectoscope was removed.  A Foley catheter was not placed.  After anesthetic reversal he was transported to the PACU in stable condition.    Delray Reza [employer]. MD

## 2018-02-12 NOTE — Anesthesia Postprocedure Evaluation (Signed)
Anesthesia Post Note  Patient: Thomas Olson  Procedure(s) Performed: CYSTOSCOPY WITH INSERTION OF UROLIFT (N/A ) CYSTOSCOPY WITH LITHOLAPAXY (N/A ) HOLMIUM LASER APPLICATION (N/A )  Patient location during evaluation: PACU Anesthesia Type: General Level of consciousness: awake and alert Pain management: pain level controlled Vital Signs Assessment: post-procedure vital signs reviewed and stable Respiratory status: spontaneous breathing, nonlabored ventilation and respiratory function stable Cardiovascular status: blood pressure returned to baseline and stable Postop Assessment: no apparent nausea or vomiting Anesthetic complications: no     Last Vitals:  Vitals:   02/12/18 1134 02/12/18 1154  BP: 136/65 128/65  Pulse: 61 (!) 58  Resp: 16 16  Temp: (!) 35.9 C   SpO2: 100% 100%    Last Pain:  Vitals:   02/12/18 1154  TempSrc:   PainSc: 5                  Jovita GammaKathryn L Fitzgerald

## 2018-02-23 ENCOUNTER — Telehealth: Payer: Self-pay | Admitting: Urology

## 2018-02-23 NOTE — Telephone Encounter (Signed)
Patient had Urolift procedure with Dr. Lonna Cobb on 02/12/2018.  He called over the weekend and left a voice mail message that he would like someone from the clinical staff to return his call.  He has questions about the procedure.  He can be reached at [phone].

## 2018-02-23 NOTE — Telephone Encounter (Signed)
Left message for pt to call office

## 2018-02-23 NOTE — Telephone Encounter (Signed)
Spoke with pt, he wanted to ask if it was normal to have the urge to urinate frequently after procedure. Pt states he is feeling better today though. I informed him due to irritation and inflammation this is not uncommon. Pt instructed to call our office if problems persist or worsen.

## 2018-03-11 ENCOUNTER — Encounter: Payer: Self-pay | Admitting: Urology

## 2018-03-11 ENCOUNTER — Ambulatory Visit (INDEPENDENT_AMBULATORY_CARE_PROVIDER_SITE_OTHER): Payer: Medicare Other | Admitting: Urology

## 2018-03-11 VITALS — BP 96/56 | HR 61 | Ht 67.0 in | Wt 170.0 lb

## 2018-03-11 DIAGNOSIS — N401 Enlarged prostate with lower urinary tract symptoms: Secondary | ICD-10-CM | POA: Diagnosis not present

## 2018-03-11 DIAGNOSIS — N138 Other obstructive and reflux uropathy: Secondary | ICD-10-CM

## 2018-03-11 NOTE — Progress Notes (Signed)
03/11/2018 1:14 PM   Thomas Olson 10-17-1950 [id number]  Referring provider: Cain Sieve, MD [address] Medicine [address] [facility] [place], [place] [address]  Chief Complaint  Patient presents with  . Follow-up    HPI: 67 year old male with severe lower urinary tract symptoms presents for postop follow-up.  He underwent placement of UroLift and cystolitholapaxy on 02/12/2018.  Preop IPSS was 33/35.  He had no postoperative problems.  He states he is voiding with an excellent stream and his intermittency resolved.  He has frequency and sensation of incomplete emptying which is improving.  IPSS completed today was 3/35 with a QOL rated 1/6.   PMH: Past Medical History:  Diagnosis Date  . CVA (cerebral vascular accident) (HCC)   . Depression   . History of kidney stones   . Kidney stones   . Sleep apnea     Surgical History: Past Surgical History:  Procedure Laterality Date  . CYSTOSCOPY    . CYSTOSCOPY WITH INSERTION OF UROLIFT N/A 02/12/2018   Procedure: CYSTOSCOPY WITH INSERTION OF UROLIFT;  Surgeon: Riki Altes, MD;  Location: [facility];  Service: Urology;  Laterality: N/A;  . CYSTOSCOPY WITH LITHOLAPAXY N/A 02/12/2018   Procedure: CYSTOSCOPY WITH LITHOLAPAXY;  Surgeon: Riki Altes, MD;  Location: [facility];  Service: Urology;  Laterality: N/A;  . HOLMIUM LASER APPLICATION N/A 02/12/2018   Procedure: HOLMIUM LASER APPLICATION;  Surgeon: Riki Altes, MD;  Location: [facility];  Service: Urology;  Laterality: N/A;  . LITHOTRIPSY      Home Medications:  Allergies as of 03/11/2018      Reactions   Dilaudid [hydromorphone Hcl] Anaphylaxis      Medication List        Accurate as of 03/11/18  1:14 PM. Always use your most recent med list.          aspirin EC 81 MG tablet Take 81 mg by mouth daily.   atorvastatin 80 MG tablet Commonly known as:  LIPITOR Take 80 mg by mouth daily.   citalopram 20 MG tablet Commonly known  as:  CELEXA Take 20 mg by mouth daily.   tamsulosin 0.4 MG Caps capsule Commonly known as:  FLOMAX Take 0.4 mg by mouth every evening.       Allergies:  Allergies  Allergen Reactions  . Dilaudid [Hydromorphone Hcl] Anaphylaxis    Family History: History reviewed. No pertinent family history.  Social History:  reports that he has never smoked. He has never used smokeless tobacco. He reports that he does not drink alcohol or use drugs.  ROS: UROLOGY Frequent Urination?: No Hard to postpone urination?: No Burning/pain with urination?: No Get up at night to urinate?: No Leakage of urine?: No Urine stream starts and stops?: No Trouble starting stream?: No Do you have to strain to urinate?: No Blood in urine?: No Urinary tract infection?: No Sexually transmitted disease?: No Injury to kidneys or bladder?: No Painful intercourse?: No Weak stream?: No Erection problems?: No Penile pain?: No  Gastrointestinal Nausea?: No Vomiting?: No Indigestion/heartburn?: No Diarrhea?: No Constipation?: No  Constitutional Fever: No Night sweats?: No Weight loss?: No Fatigue?: No  Skin Skin rash/lesions?: No Itching?: No  Eyes Blurred vision?: No Double vision?: No  Ears/Nose/Throat Sore throat?: No Sinus problems?: No  Hematologic/Lymphatic Swollen glands?: No Easy bruising?: No  Cardiovascular Leg swelling?: No Chest pain?: No  Respiratory Cough?: No Shortness of breath?: No  Endocrine Excessive thirst?: No  Musculoskeletal Back pain?: No Joint  pain?: No  Neurological Headaches?: No Dizziness?: No  Psychologic Depression?: No Anxiety?: No  Physical Exam: BP (!) 96/56   Pulse 61   Ht 5\' 7"  (1.702 m)   Wt 170 lb (77.1 kg)   BMI 26.63 kg/m   Constitutional:  Alert and oriented, No acute distress.    Assessment & Plan:   Doing well status post UroLift placement and cystolitholapaxy.  PVR by bladder scan was 0 mL.  Follow-up 6  months.   Riki AltesScott C Mikeisha Lemonds, MD  [facility] [address], [address] [place], [place] [id number] [phone]

## 2018-09-08 ENCOUNTER — Encounter: Payer: Self-pay | Admitting: Urology

## 2018-09-08 ENCOUNTER — Ambulatory Visit: Payer: Medicare Other | Admitting: Urology

## 2018-09-08 ENCOUNTER — Other Ambulatory Visit: Payer: Self-pay

## 2018-09-08 VITALS — BP 116/66 | HR 55 | Ht 67.0 in | Wt 170.0 lb

## 2018-09-08 DIAGNOSIS — N138 Other obstructive and reflux uropathy: Secondary | ICD-10-CM | POA: Diagnosis not present

## 2018-09-08 DIAGNOSIS — N401 Enlarged prostate with lower urinary tract symptoms: Secondary | ICD-10-CM

## 2018-09-08 LAB — BLADDER SCAN AMB NON-IMAGING

## 2018-09-09 ENCOUNTER — Encounter: Payer: Self-pay | Admitting: Urology

## 2018-09-09 NOTE — Progress Notes (Signed)
09/08/2018 7:39 AM   Thomas Olson 05-10-1951 [id number]  Referring provider: Cain Sieve, MD [address] [facility] [place], [place] [address]  Chief Complaint  Patient presents with  . Follow-up    HPI: 68 year old male presents for follow-up of BPH.  He underwent UroLift placement 02/2018 for severe lower urinary tract symptoms.  He states he continues to do very well and has no bothersome lower urinary tract symptoms.  He is voiding with an excellent stream.  His preprocedure IPSS was 33/35 and 1 month follow-up was 3/35.  He is no longer taking tamsulosin.   PMH: Past Medical History:  Diagnosis Date  . CVA (cerebral vascular accident) (HCC)   . Depression   . History of kidney stones   . Kidney stones   . Sleep apnea     Surgical History: Past Surgical History:  Procedure Laterality Date  . CYSTOSCOPY    . CYSTOSCOPY WITH INSERTION OF UROLIFT N/A 02/12/2018   Procedure: CYSTOSCOPY WITH INSERTION OF UROLIFT;  Surgeon: Riki Altes, MD;  Location: [facility];  Service: Urology;  Laterality: N/A;  . CYSTOSCOPY WITH LITHOLAPAXY N/A 02/12/2018   Procedure: CYSTOSCOPY WITH LITHOLAPAXY;  Surgeon: Riki Altes, MD;  Location: [facility];  Service: Urology;  Laterality: N/A;  . HOLMIUM LASER APPLICATION N/A 02/12/2018   Procedure: HOLMIUM LASER APPLICATION;  Surgeon: Riki Altes, MD;  Location: [facility];  Service: Urology;  Laterality: N/A;  . LITHOTRIPSY      Home Medications:  Allergies as of 09/08/2018      Reactions   Dilaudid [hydromorphone Hcl] Anaphylaxis      Medication List       Accurate as of September 08, 2018 11:59 PM. Always use your most recent med list.        aspirin EC 81 MG tablet Take 81 mg by mouth daily.   atorvastatin 80 MG tablet Commonly known as:  LIPITOR Take 80 mg by mouth daily.   citalopram 20 MG tablet Commonly known as:  CELEXA Take 20 mg by mouth daily.       Allergies:   Allergies  Allergen Reactions  . Dilaudid [Hydromorphone Hcl] Anaphylaxis    Family History: No family history on file.  Social History:  reports that he has never smoked. He has never used smokeless tobacco. He reports that he does not drink alcohol or use drugs.  ROS: UROLOGY Frequent Urination?: No Hard to postpone urination?: No Burning/pain with urination?: No Get up at night to urinate?: No Leakage of urine?: No Urine stream starts and stops?: No Trouble starting stream?: No Do you have to strain to urinate?: No Blood in urine?: No Urinary tract infection?: No Sexually transmitted disease?: No Injury to kidneys or bladder?: No Painful intercourse?: No Weak stream?: No Erection problems?: No Penile pain?: No  Gastrointestinal Nausea?: No Vomiting?: No Indigestion/heartburn?: No Diarrhea?: No Constipation?: No  Constitutional Fever: No Night sweats?: No Weight loss?: No Fatigue?: No  Skin Skin rash/lesions?: No Itching?: No  Eyes Blurred vision?: No Double vision?: No  Ears/Nose/Throat Sore throat?: No Sinus problems?: No  Hematologic/Lymphatic Swollen glands?: No Easy bruising?: No  Cardiovascular Leg swelling?: No Chest pain?: No  Respiratory Cough?: No Shortness of breath?: No  Endocrine Excessive thirst?: No  Musculoskeletal Back pain?: No Joint pain?: No  Neurological Headaches?: No Dizziness?: No  Psychologic Depression?: No Anxiety?: No  Physical Exam: BP 116/66 (BP Location: Left Arm, Patient Position: Sitting, Cuff Size: Normal)  Pulse (!) 55   Ht 5\' 7"  (1.702 m)   Wt 170 lb (77.1 kg)   BMI 26.63 kg/m   Constitutional:  Alert and oriented, No acute distress. HEENT: [place] AT, moist mucus membranes.  Trachea midline, no masses. Cardiovascular: No clubbing, cyanosis, or edema. Respiratory: Normal respiratory effort, no increased work of breathing.  Assessment & Plan:   68 year old male with BPH continues to do well  status post UroLift.  PVR by bladder scan today was 16 mL.  Return in about 1 year (around 09/08/2019) for Recheck.   Riki Altes, MD  [facility] [address] [place], [place] [address] [phone]

## 2019-09-09 ENCOUNTER — Ambulatory Visit: Payer: Medicare Other | Admitting: Urology

## 2019-12-08 ENCOUNTER — Ambulatory Visit: Payer: Medicare Other | Admitting: Urology

## 2020-02-29 ENCOUNTER — Other Ambulatory Visit: Payer: Self-pay

## 2020-02-29 ENCOUNTER — Encounter: Payer: Self-pay | Admitting: Urology

## 2020-02-29 ENCOUNTER — Ambulatory Visit: Payer: Medicare Other | Admitting: Urology

## 2020-02-29 VITALS — BP 129/77 | HR 52 | Ht 67.0 in | Wt 177.0 lb

## 2020-02-29 DIAGNOSIS — R310 Gross hematuria: Secondary | ICD-10-CM

## 2020-02-29 DIAGNOSIS — N401 Enlarged prostate with lower urinary tract symptoms: Secondary | ICD-10-CM | POA: Diagnosis not present

## 2020-02-29 DIAGNOSIS — N138 Other obstructive and reflux uropathy: Secondary | ICD-10-CM | POA: Diagnosis not present

## 2020-02-29 LAB — BLADDER SCAN AMB NON-IMAGING: Scan Result: 15

## 2020-02-29 NOTE — Progress Notes (Signed)
02/29/2020 9:56 AM   Thomas Olson 1950/07/02 [id number]  Referring provider: Cain Sieve, MD [address] Medicine [id number] [facility] [place],  [place] [address]  Chief Complaint  Patient presents with  . Benign Prostatic Hypertrophy    Urologic history: 1.  BPH with LUTS -Severe lower urinary tract symptoms refractory to medical management -UroLift 02/2018   HPI: 69 y.o. male presents for annual follow-up   Last visit 09/08/2018  Episode gross hematuria ~10 days ago; blood initially described as bright red then brown.  Recurrent episode 5 days ago which resolved in a few hours  No dysuria or voiding symptoms but had pelvic discomfort which has resolved   Continues to do quite well after UroLift  Voiding with an excellent stream  IPSS today 7/35 (33/35 preop, 3/35 1 month postop)   PMH: Past Medical History:  Diagnosis Date  . CVA (cerebral vascular accident) (HCC)   . Depression   . History of kidney stones   . Kidney stones   . Sleep apnea     Surgical History: Past Surgical History:  Procedure Laterality Date  . CYSTOSCOPY    . CYSTOSCOPY WITH INSERTION OF UROLIFT N/A 02/12/2018   Procedure: CYSTOSCOPY WITH INSERTION OF UROLIFT;  Surgeon: Riki Altes, MD;  Location: [facility];  Service: Urology;  Laterality: N/A;  . CYSTOSCOPY WITH LITHOLAPAXY N/A 02/12/2018   Procedure: CYSTOSCOPY WITH LITHOLAPAXY;  Surgeon: Riki Altes, MD;  Location: [facility];  Service: Urology;  Laterality: N/A;  . HOLMIUM LASER APPLICATION N/A 02/12/2018   Procedure: HOLMIUM LASER APPLICATION;  Surgeon: Riki Altes, MD;  Location: [facility];  Service: Urology;  Laterality: N/A;  . LITHOTRIPSY      Home Medications:  Allergies as of 02/29/2020      Reactions   Dilaudid [hydromorphone Hcl] Anaphylaxis      Medication List       Accurate as of February 29, 2020  9:56 AM. If you have any questions, ask your nurse or doctor.        aspirin  EC 81 MG tablet Take 81 mg by mouth daily.   atorvastatin 80 MG tablet Commonly known as: LIPITOR Take 80 mg by mouth daily.   citalopram 20 MG tablet Commonly known as: CELEXA Take 20 mg by mouth daily.       Allergies:  Allergies  Allergen Reactions  . Dilaudid [Hydromorphone Hcl] Anaphylaxis    Family History: No family history on file.  Social History:  reports that he has never smoked. He has never used smokeless tobacco. He reports that he does not drink alcohol and does not use drugs.   Physical Exam: BP 129/77   Pulse (!) 52   Ht 5\' 7"  (1.702 m)   Wt 177 lb (80.3 kg)   BMI 27.72 kg/m   Constitutional:  Alert and oriented, No acute distress. HEENT: [place] AT, moist mucus membranes.  Trachea midline, no masses. Cardiovascular: No clubbing, cyanosis, or edema. Respiratory: Normal respiratory effort, no increased work of breathing. GI: Abdomen is soft, nontender, nondistended, no abdominal masses GU: Prostate 40 g, flat, smooth without nodules Skin: No rashes, bruises or suspicious lesions. Neurologic: Grossly intact, no focal deficits, moving all 4 extremities. Psychiatric: Normal mood and affect.   Assessment & Plan:    1.  Gross hematuria  New problem; AUA risk stratification: High  We discussed the standard recommended evaluation for high risk hematuria to include CT urogram and cystoscopy.   Cystoscopy  tentatively scheduled and CT order placed   2.  BPH with LUTS  Doing well status post UroLift  PVR by bladder scan today 15 mL  Recent blood work by PCP did not include PSA and will obtain on follow-up   Riki Altes, MD  [facility] [address], [address] [place], [place] [address] [phone]

## 2020-03-20 ENCOUNTER — Ambulatory Visit
Admission: RE | Admit: 2020-03-20 | Discharge: 2020-03-20 | Disposition: A | Discharge status: Home / Self Care | Payer: Medicare Other | Source: Ambulatory Visit | Attending: Urology | Admitting: Urology

## 2020-03-20 ENCOUNTER — Other Ambulatory Visit: Payer: Self-pay

## 2020-03-20 DIAGNOSIS — R310 Gross hematuria: Secondary | ICD-10-CM | POA: Insufficient documentation

## 2020-03-20 LAB — POCT I-STAT CREATININE: Creatinine, Ser: 1 mg/dL (ref 0.61–1.24)

## 2020-03-20 MED ORDER — IOHEXOL 300 MG/ML  SOLN
125.0000 mL | Freq: Once | INTRAMUSCULAR | Status: AC | PRN
  Administered 2020-03-20: 125 mL via INTRAVENOUS

## 2020-03-26 ENCOUNTER — Other Ambulatory Visit: Payer: Self-pay | Admitting: *Deleted

## 2020-03-26 DIAGNOSIS — N200 Calculus of kidney: Secondary | ICD-10-CM

## 2020-03-26 DIAGNOSIS — N138 Other obstructive and reflux uropathy: Secondary | ICD-10-CM

## 2020-03-29 ENCOUNTER — Ambulatory Visit
Admission: RE | Admit: 2020-03-29 | Discharge: 2020-03-29 | Disposition: A | Discharge status: Home / Self Care | Payer: Medicare Other | Attending: Urology | Admitting: Urology

## 2020-03-29 ENCOUNTER — Ambulatory Visit
Admission: RE | Admit: 2020-03-29 | Discharge: 2020-03-29 | Disposition: A | Discharge status: Home / Self Care | Payer: Medicare Other | Source: Ambulatory Visit | Attending: Urology | Admitting: Urology

## 2020-03-29 ENCOUNTER — Other Ambulatory Visit: Payer: Self-pay

## 2020-03-29 DIAGNOSIS — N200 Calculus of kidney: Secondary | ICD-10-CM | POA: Diagnosis present

## 2020-03-29 NOTE — H&P (View-Only) (Signed)
03/30/2020 12:23 PM   Thomas Olson [birth year] [id number]  Referring provider: Cain Sieve, MD [address] [place],  [place] [address] No chief complaint on file.  Urologic history: 1.  BPH with LUTS -Severe lower urinary tract symptoms refractory to medical management -UroLift 02/2018  HPI: Thomas Olson is a 69 y.o. male who returns for follow up of gross hematuria and BPH with LUTS.   -episode of gross hematuria August 2021, blood initially described as bright red then brown.  Recurrent episode resolved. -Status post UroLift and no bothersome LUTS -He was voiding well with excellent stream -CTU on 03/20/2020 noted multiple small bilateral nonobstructive renal calculi. There is an 8 mm calculus in the distal third of the right ureter,  -Multiple small calculi in the urinary bladder. Urolift implants in the prostate. -KUB yesterday showed large distal ureteral calculus and bladder calculi.  -Has noted mild right flank pain today   PMH: Past Medical History:  Diagnosis Date  . CVA (cerebral vascular accident) (HCC)   . Depression   . History of kidney stones   . Kidney stones   . Sleep apnea     Surgical History: Past Surgical History:  Procedure Laterality Date  . CYSTOSCOPY    . CYSTOSCOPY WITH INSERTION OF UROLIFT N/A 02/12/2018   Procedure: CYSTOSCOPY WITH INSERTION OF UROLIFT;  Surgeon: Riki Altes, MD;  Location: [facility];  Service: Urology;  Laterality: N/A;  . CYSTOSCOPY WITH LITHOLAPAXY N/A 02/12/2018   Procedure: CYSTOSCOPY WITH LITHOLAPAXY;  Surgeon: Riki Altes, MD;  Location: [facility];  Service: Urology;  Laterality: N/A;  . HOLMIUM LASER APPLICATION N/A 02/12/2018   Procedure: HOLMIUM LASER APPLICATION;  Surgeon: Riki Altes, MD;  Location: [facility];  Service: Urology;  Laterality: N/A;  . LITHOTRIPSY      Home Medications:  Allergies as of 03/30/2020      Reactions   Dilaudid [hydromorphone Hcl] Anaphylaxis       Medication List       Accurate as of March 29, 2020 12:23 PM. If you have any questions, ask your nurse or doctor.        aspirin EC 81 MG tablet Take 81 mg by mouth daily.   atorvastatin 80 MG tablet Commonly known as: LIPITOR Take 80 mg by mouth daily.   citalopram 20 MG tablet Commonly known as: CELEXA Take 20 mg by mouth daily.       Allergies:  Allergies  Allergen Reactions  . Dilaudid [Hydromorphone Hcl] Anaphylaxis    Family History: No family history on file.  Social History:  reports that he has never smoked. He has never used smokeless tobacco. He reports that he does not drink alcohol and does not use drugs.   Physical Exam: There were no vitals taken for this visit.  Constitutional:  Alert and oriented, No acute distress. HEENT: [place] AT, moist mucus membranes.  Trachea midline, no masses. Cardiovascular: No clubbing, cyanosis, or edema.  Clear Respiratory: Normal respiratory effort, no increased work of breathing.  RRR GI: Abdomen is soft, nontender, nondistended, no abdominal masses GU: No CVA tenderness Lymph: No cervical or inguinal lymphadenopathy. Skin: No rashes, bruises or suspicious lesions. Neurologic: Grossly intact, no focal deficits, moving all 4 extremities. Psychiatric: Normal mood and affect.    Pertinent Imaging:   CT/KUB images personally reviewed  CT HEMATURIA WORKUP  Narrative CLINICAL DATA:  Gross hematuria  EXAM: CT ABDOMEN AND PELVIS WITHOUT AND WITH CONTRAST  TECHNIQUE:  Multidetector CT imaging of the abdomen and pelvis was performed following the standard protocol before and following the bolus administration of intravenous contrast.  CONTRAST:  OMNIPAQUE IOHEXOL 300 MG/ML  SOLN  COMPARISON:  None.  FINDINGS: Lower chest: No acute abnormality. Mild bandlike scarring of the dependent bilateral lung bases.  Hepatobiliary: No solid liver abnormality is seen. No gallstones, gallbladder wall thickening, or  biliary dilatation.  Pancreas: Unremarkable. No pancreatic ductal dilatation or surrounding inflammatory changes.  Spleen: Normal in size without significant abnormality.  Adrenals/Urinary Tract: Adrenal glands are unremarkable. Multiple small bilateral nonobstructive renal calculi. No mass or suspicious contrast enhancement. There is an 8 mm calculus in the distal third of the right ureter (series 2, image 75). No hydronephrosis. Multiple small calculi in the urinary bladder.  Stomach/Bowel: Stomach is within normal limits. Appendix appears normal. No evidence of bowel wall thickening, distention, or inflammatory changes.  Vascular/Lymphatic: Aortic atherosclerosis. No enlarged abdominal or pelvic lymph nodes.  Reproductive: Urolift implants in the prostate.  Other: No abdominal wall hernia or abnormality. No abdominopelvic ascites.  Musculoskeletal: No acute or significant osseous findings.  IMPRESSION: 1. Multiple small bilateral nonobstructive renal calculi. There is an 8 mm calculus in the distal third of the right ureter, which appears to be nonobstructive given lack of hydronephrosis or hydroureter. 2. Multiple small calculi in the urinary bladder. 3. Urolift implants in the prostate. 4. Aortic Atherosclerosis (ICD10-I70.0).   Electronically Signed By: Lauralyn Primes M.D. On: 03/20/2020 14:49    Assessment & Plan:    1.  Right distal ureteral calculus  No hydronephrosis/hydroureter  Stone density 1300 HU  We discussed the ureteral calculus unlikely to pass spontaneously and based on density and location would recommend ureteroscopic removal along with cystolitholapaxy under the same anesthetic  The indications and nature of the planned procedure were discussed as well as the potential  benefits and expected outcome.  Alternatives have been discussed in detail. The most common complications and side effects were discussed including but not limited to  infection/sepsis; blood loss; damage to urethra, bladder, ureter, kidney; need for multiple surgeries; need for prolonged stent placement as well as general anesthesia risks. Although uncommon she was also informed of the possibility that the calculus may not be able to be treated due to inability to obtain access to the upper ureter. In that event she would require stent placement and a follow-up procedure after a period of stent dilation. All of his questions were answered and he desires to proceed.     [facility] [address] [place], [place] [address] [phone]  I, Theador Hawthorne, am acting as a scribe for Dr. Lorin Picket C. Bryleigh Ottaway,  I have reviewed the above documentation for accuracy and completeness, and I agree with the above.    Riki Altes, MD

## 2020-03-29 NOTE — Progress Notes (Signed)
 03/30/2020 12:23 PM   Delton L Kurtz 05/30/1951 [id number]  Referring provider: Klipstein, Christopher, MD [address] [place],  [place] [address] No chief complaint on file.  Urologic history: 1.  BPH with LUTS -Severe lower urinary tract symptoms refractory to medical management -UroLift 02/2018  HPI: Thomas Olson is a 69 y.o. male who returns for follow up of gross hematuria and BPH with LUTS.   -episode of gross hematuria August 2021, blood initially described as bright red then brown.  Recurrent episode resolved. -Status post UroLift and no bothersome LUTS -He was voiding well with excellent stream -CTU on 03/20/2020 noted multiple small bilateral nonobstructive renal calculi. There is an 8 mm calculus in the distal third of the right ureter,  -Multiple small calculi in the urinary bladder. Urolift implants in the prostate. -KUB yesterday showed large distal ureteral calculus and bladder calculi.  -Has noted mild right flank pain today   PMH: Past Medical History:  Diagnosis Date  . CVA (cerebral vascular accident) (HCC)   . Depression   . History of kidney stones   . Kidney stones   . Sleep apnea     Surgical History: Past Surgical History:  Procedure Laterality Date  . CYSTOSCOPY    . CYSTOSCOPY WITH INSERTION OF UROLIFT N/A 02/12/2018   Procedure: CYSTOSCOPY WITH INSERTION OF UROLIFT;  Surgeon: Debbera Wolken C, MD;  Location: [facility];  Service: Urology;  Laterality: N/A;  . CYSTOSCOPY WITH LITHOLAPAXY N/A 02/12/2018   Procedure: CYSTOSCOPY WITH LITHOLAPAXY;  Surgeon: Avamae Dehaan C, MD;  Location: [facility];  Service: Urology;  Laterality: N/A;  . HOLMIUM LASER APPLICATION N/A 02/12/2018   Procedure: HOLMIUM LASER APPLICATION;  Surgeon: Joahan Swatzell C, MD;  Location: [facility];  Service: Urology;  Laterality: N/A;  . LITHOTRIPSY      Home Medications:  Allergies as of 03/30/2020      Reactions   Dilaudid [hydromorphone Hcl] Anaphylaxis       Medication List       Accurate as of March 29, 2020 12:23 PM. If you have any questions, ask your nurse or doctor.        aspirin EC 81 MG tablet Take 81 mg by mouth daily.   atorvastatin 80 MG tablet Commonly known as: LIPITOR Take 80 mg by mouth daily.   citalopram 20 MG tablet Commonly known as: CELEXA Take 20 mg by mouth daily.       Allergies:  Allergies  Allergen Reactions  . Dilaudid [Hydromorphone Hcl] Anaphylaxis    Family History: No family history on file.  Social History:  reports that he has never smoked. He has never used smokeless tobacco. He reports that he does not drink alcohol and does not use drugs.   Physical Exam: There were no vitals taken for this visit.  Constitutional:  Alert and oriented, No acute distress. HEENT: [place] AT, moist mucus membranes.  Trachea midline, no masses. Cardiovascular: No clubbing, cyanosis, or edema.  Clear Respiratory: Normal respiratory effort, no increased work of breathing.  RRR GI: Abdomen is soft, nontender, nondistended, no abdominal masses GU: No CVA tenderness Lymph: No cervical or inguinal lymphadenopathy. Skin: No rashes, bruises or suspicious lesions. Neurologic: Grossly intact, no focal deficits, moving all 4 extremities. Psychiatric: Normal mood and affect.    Pertinent Imaging:   CT/KUB images personally reviewed  CT HEMATURIA WORKUP  Narrative CLINICAL DATA:  Gross hematuria  EXAM: CT ABDOMEN AND PELVIS WITHOUT AND WITH CONTRAST  TECHNIQUE:   Multidetector CT imaging of the abdomen and pelvis was performed following the standard protocol before and following the bolus administration of intravenous contrast.  CONTRAST:  125mL OMNIPAQUE IOHEXOL 300 MG/ML  SOLN  COMPARISON:  None.  FINDINGS: Lower chest: No acute abnormality. Mild bandlike scarring of the dependent bilateral lung bases.  Hepatobiliary: No solid liver abnormality is seen. No gallstones, gallbladder wall thickening, or  biliary dilatation.  Pancreas: Unremarkable. No pancreatic ductal dilatation or surrounding inflammatory changes.  Spleen: Normal in size without significant abnormality.  Adrenals/Urinary Tract: Adrenal glands are unremarkable. Multiple small bilateral nonobstructive renal calculi. No mass or suspicious contrast enhancement. There is an 8 mm calculus in the distal third of the right ureter (series 2, image 75). No hydronephrosis. Multiple small calculi in the urinary bladder.  Stomach/Bowel: Stomach is within normal limits. Appendix appears normal. No evidence of bowel wall thickening, distention, or inflammatory changes.  Vascular/Lymphatic: Aortic atherosclerosis. No enlarged abdominal or pelvic lymph nodes.  Reproductive: Urolift implants in the prostate.  Other: No abdominal wall hernia or abnormality. No abdominopelvic ascites.  Musculoskeletal: No acute or significant osseous findings.  IMPRESSION: 1. Multiple small bilateral nonobstructive renal calculi. There is an 8 mm calculus in the distal third of the right ureter, which appears to be nonobstructive given lack of hydronephrosis or hydroureter. 2. Multiple small calculi in the urinary bladder. 3. Urolift implants in the prostate. 4. Aortic Atherosclerosis (ICD10-I70.0).   Electronically Signed By: Alex  Bibbey M.D. On: 03/20/2020 14:49    Assessment & Plan:    1.  Right distal ureteral calculus  No hydronephrosis/hydroureter  Stone density 1300 HU  We discussed the ureteral calculus unlikely to pass spontaneously and based on density and location would recommend ureteroscopic removal along with cystolitholapaxy under the same anesthetic  The indications and nature of the planned procedure were discussed as well as the potential  benefits and expected outcome.  Alternatives have been discussed in detail. The most common complications and side effects were discussed including but not limited to  infection/sepsis; blood loss; damage to urethra, bladder, ureter, kidney; need for multiple surgeries; need for prolonged stent placement as well as general anesthesia risks. Although uncommon she was also informed of the possibility that the calculus may not be able to be treated due to inability to obtain access to the upper ureter. In that event she would require stent placement and a follow-up procedure after a period of stent dilation. All of his questions were answered and he desires to proceed.     [facility] [address], [address] [place], [place] [address] [phone]  I, Alexis Patterson, am acting as a scribe for Dr. Seger Jani C. Carmeline Kowal,  I have reviewed the above documentation for accuracy and completeness, and I agree with the above.    Garnet Chatmon C Shaasia Odle, MD   

## 2020-03-30 ENCOUNTER — Other Ambulatory Visit: Payer: Self-pay | Admitting: Radiology

## 2020-03-30 ENCOUNTER — Encounter: Payer: Self-pay | Admitting: Urology

## 2020-03-30 ENCOUNTER — Encounter: Payer: Self-pay | Admitting: Radiology

## 2020-03-30 ENCOUNTER — Ambulatory Visit: Payer: Medicare Other | Admitting: Urology

## 2020-03-30 VITALS — BP 118/71 | HR 51 | Ht 67.0 in | Wt 175.0 lb

## 2020-03-30 DIAGNOSIS — G4730 Sleep apnea, unspecified: Secondary | ICD-10-CM | POA: Insufficient documentation

## 2020-03-30 DIAGNOSIS — N201 Calculus of ureter: Secondary | ICD-10-CM

## 2020-03-30 DIAGNOSIS — R310 Gross hematuria: Secondary | ICD-10-CM

## 2020-03-30 DIAGNOSIS — N401 Enlarged prostate with lower urinary tract symptoms: Secondary | ICD-10-CM | POA: Diagnosis not present

## 2020-03-30 DIAGNOSIS — N210 Calculus in bladder: Secondary | ICD-10-CM | POA: Diagnosis not present

## 2020-03-30 DIAGNOSIS — N138 Other obstructive and reflux uropathy: Secondary | ICD-10-CM

## 2020-03-30 LAB — URINALYSIS, COMPLETE
Bilirubin, UA: NEGATIVE
Glucose, UA: NEGATIVE
Ketones, UA: NEGATIVE
Leukocytes,UA: NEGATIVE
Nitrite, UA: NEGATIVE
Protein,UA: NEGATIVE
Specific Gravity, UA: 1.025 (ref 1.005–1.030)
Urobilinogen, Ur: 0.2 mg/dL (ref 0.2–1.0)
pH, UA: 5.5 (ref 5.0–7.5)

## 2020-03-30 LAB — MICROSCOPIC EXAMINATION
Bacteria, UA: NONE SEEN
Epithelial Cells (non renal): NONE SEEN /hpf (ref 0–10)
WBC, UA: NONE SEEN /hpf (ref 0–5)

## 2020-03-30 NOTE — Addendum Note (Signed)
Addended by: Levada Schilling on: 03/30/2020 09:13 AM   Modules accepted: Orders

## 2020-04-03 LAB — CULTURE, URINE COMPREHENSIVE

## 2020-04-05 ENCOUNTER — Encounter
Admission: RE | Admit: 2020-04-05 | Discharge: 2020-04-05 | Disposition: A | Discharge status: Home / Self Care | Payer: Medicare Other | Source: Ambulatory Visit | Attending: Urology | Admitting: Urology

## 2020-04-05 NOTE — Patient Instructions (Addendum)
Your procedure is scheduled on: 04/10/20- Tuesday Report to Day Surgery on the 2nd floor of the [facility]. To find out your arrival time, please call [phone] between 1PM - 3PM on: 04/09/20- Monday  REMEMBER: Instructions that are not followed completely may result in serious medical risk, up to and including death; or upon the discretion of your surgeon and anesthesiologist your surgery may need to be rescheduled.  Do not eat food after midnight the night before surgery.  No gum chewing, lozengers or hard candies.  You may however, drink CLEAR liquids up to 2 hours before you are scheduled to arrive for your surgery. Do not drink anything within 2 hours of your scheduled arrival time.  Clear liquids include: - water  - apple juice without pulp - gatorade (not RED) - black coffee or tea (Do NOT add milk or creamers to the coffee or tea) Do NOT drink anything that is not on this list.  TAKE THESE MEDICATIONS THE MORNING OF SURGERY WITH A SIP OF WATER: - Citalopram 20 mg - Atorvastatin 80 mg  Follow recommendations from Cardiologist, Pulmonologist or PCP regarding stopping Aspirin, Coumadin, Plavix, Eliquis, Pradaxa, or Pletal. Stopped Aspirin on 04/03/20.  One week prior to surgery: Stop Anti-inflammatories (NSAIDS) such as Advil, Aleve, Ibuprofen, Motrin, Naproxen, Naprosyn and Aspirin based products such as Excedrin, Goodys Powder, BC Powder. Stop ANY OVER THE COUNTER supplements until after surgery. (You may continue taking Tylenol, Vitamin D, Vitamin B, and multivitamin.)  No Alcohol for 24 hours before or after surgery.  No Smoking including e-cigarettes for 24 hours prior to surgery.  No chewable tobacco products for at least 6 hours prior to surgery.  No nicotine patches on the day of surgery.  Do not use any "recreational" drugs for at least a week prior to your surgery.  Please be advised that the combination of cocaine and anesthesia may have negative outcomes,  up to and including death. If you test positive for cocaine, your surgery will be cancelled.  On the morning of surgery brush your teeth with toothpaste and water, you may rinse your mouth with mouthwash if you wish. Do not swallow any toothpaste or mouthwash.  Do not wear jewelry, make-up, hairpins, clips or nail polish.  Do not wear lotions, powders, or perfumes.   Do not shave 48 hours prior to surgery.   Contact lenses, hearing aids and dentures may not be worn into surgery.  Do not bring valuables to the hospital. [facility] is not responsible for any missing/lost belongings or valuables.   Notify your doctor if there is any change in your medical condition (cold, fever, infection).  Wear comfortable clothing (specific to your surgery type) to the hospital.  Plan for stool softeners for home use; pain medications have a tendency to cause constipation. You can also help prevent constipation by eating foods high in fiber such as fruits and vegetables and drinking plenty of fluids as your diet allows.  After surgery, you can help prevent lung complications by doing breathing exercises.  Take deep breaths and cough every 1-2 hours. Your doctor may order a device called an Incentive Spirometer to help you take deep breaths. When coughing or sneezing, hold a pillow firmly against your incision with both hands. This is called "splinting." Doing this helps protect your incision. It also decreases belly discomfort.  If you are being admitted to the hospital overnight, leave your suitcase in the car. After surgery it may be brought to your room.  If you are being discharged the day of surgery, you will not be allowed to drive home. You will need a responsible adult (18 years or older) to drive you home and stay with you that night.   If you are taking public transportation, you will need to have a responsible adult (18 years or older) with you. Please confirm with your physician that it  is acceptable to use public transportation.   Please call the Pre-admissions Testing Dept. at [phone] if you have any questions about these instructions.  Visitation Policy:  Patients undergoing a surgery or procedure may have one family member or support person with them as long as that person is not COVID-19 positive or experiencing its symptoms.  That person may remain in the waiting area during the procedure.  Inpatient Visitation Update:   In an effort to ensure the safety of our team members and our patients, we are implementing a change to our visitation policy:  Effective Monday, Aug. 9, at 7 a.m., inpatients will be allowed one support person.  o The support person may change daily.  o The support person must pass our screening, gel in and out, and wear a mask at all times, including in the patient's room.  o Patients must also wear a mask when staff or their support person are in the room.  o Masking is required regardless of vaccination status.  Systemwide, no visitors 17 or younger.

## 2020-04-06 ENCOUNTER — Other Ambulatory Visit: Payer: Self-pay

## 2020-04-06 ENCOUNTER — Encounter
Admission: RE | Admit: 2020-04-06 | Discharge: 2020-04-06 | Disposition: A | Discharge status: Home / Self Care | Payer: Medicare Other | Source: Ambulatory Visit | Attending: Urology | Admitting: Urology

## 2020-04-06 DIAGNOSIS — Z20822 Contact with and (suspected) exposure to covid-19: Secondary | ICD-10-CM | POA: Insufficient documentation

## 2020-04-06 DIAGNOSIS — Z01812 Encounter for preprocedural laboratory examination: Secondary | ICD-10-CM | POA: Insufficient documentation

## 2020-04-06 DIAGNOSIS — Z01810 Encounter for preprocedural cardiovascular examination: Secondary | ICD-10-CM | POA: Insufficient documentation

## 2020-04-06 LAB — BASIC METABOLIC PANEL
Anion gap: 8 (ref 5–15)
BUN: 23 mg/dL (ref 8–23)
CO2: 27 mmol/L (ref 22–32)
Calcium: 9.4 mg/dL (ref 8.9–10.3)
Chloride: 104 mmol/L (ref 98–111)
Creatinine, Ser: 0.99 mg/dL (ref 0.61–1.24)
GFR, Estimated: >60 mL/min (ref >60)
Glucose, Bld: 107 mg/dL — ABNORMAL HIGH (ref 70–99)
Potassium: 3.9 mmol/L (ref 3.5–5.1)
Sodium: 139 mmol/L (ref 135–145)

## 2020-04-06 LAB — CBC
HCT: 40.3 % (ref 39.0–52.0)
Hemoglobin: 13.9 g/dL (ref 13.0–17.0)
MCH: 32 pg (ref 26.0–34.0)
MCHC: 34.5 g/dL (ref 30.0–36.0)
MCV: 92.6 fL (ref 80.0–100.0)
Platelets: 257 10*3/uL (ref 150–400)
RBC: 4.35 MIL/uL (ref 4.22–5.81)
RDW: 12.5 % (ref 11.5–15.5)
WBC: 6.4 10*3/uL (ref 4.0–10.5)
nRBC: 0 % (ref 0.0–0.2)

## 2020-04-06 LAB — SARS CORONAVIRUS 2 (TAT 6-24 HRS): SARS Coronavirus 2: NEGATIVE

## 2020-04-09 ENCOUNTER — Other Ambulatory Visit: Payer: Medicare Other

## 2020-04-10 ENCOUNTER — Ambulatory Visit: Payer: Medicare Other | Admitting: Registered Nurse

## 2020-04-10 ENCOUNTER — Other Ambulatory Visit: Payer: Medicare Other

## 2020-04-10 ENCOUNTER — Encounter: Payer: Self-pay | Admitting: Urology

## 2020-04-10 ENCOUNTER — Ambulatory Visit: Payer: Medicare Other

## 2020-04-10 ENCOUNTER — Other Ambulatory Visit: Payer: Self-pay

## 2020-04-10 ENCOUNTER — Encounter
Admission: RE | Disposition: A | Discharge status: Home / Self Care | Payer: Self-pay | Source: Home / Self Care | Attending: Urology

## 2020-04-10 ENCOUNTER — Ambulatory Visit
Admission: RE | Admit: 2020-04-10 | Discharge: 2020-04-10 | Disposition: A | Discharge status: Home / Self Care | Payer: Medicare Other | Attending: Urology | Admitting: Urology

## 2020-04-10 DIAGNOSIS — G4730 Sleep apnea, unspecified: Secondary | ICD-10-CM | POA: Diagnosis not present

## 2020-04-10 DIAGNOSIS — Z885 Allergy status to narcotic agent status: Secondary | ICD-10-CM | POA: Insufficient documentation

## 2020-04-10 DIAGNOSIS — Z87442 Personal history of urinary calculi: Secondary | ICD-10-CM | POA: Insufficient documentation

## 2020-04-10 DIAGNOSIS — N201 Calculus of ureter: Secondary | ICD-10-CM | POA: Diagnosis not present

## 2020-04-10 DIAGNOSIS — N210 Calculus in bladder: Secondary | ICD-10-CM

## 2020-04-10 DIAGNOSIS — Z87892 Personal history of anaphylaxis: Secondary | ICD-10-CM | POA: Insufficient documentation

## 2020-04-10 DIAGNOSIS — N401 Enlarged prostate with lower urinary tract symptoms: Secondary | ICD-10-CM | POA: Diagnosis not present

## 2020-04-10 DIAGNOSIS — Z8673 Personal history of transient ischemic attack (TIA), and cerebral infarction without residual deficits: Secondary | ICD-10-CM | POA: Diagnosis not present

## 2020-04-10 HISTORY — PX: CYSTOSCOPY/URETEROSCOPY/HOLMIUM LASER/STENT PLACEMENT: SHX6546

## 2020-04-10 HISTORY — PX: CYSTOSCOPY W/ RETROGRADES: SHX1426

## 2020-04-10 HISTORY — PX: CYSTOSCOPY WITH LITHOLAPAXY: SHX1425

## 2020-04-10 SURGERY — CYSTOSCOPY/URETEROSCOPY/HOLMIUM LASER/STENT PLACEMENT
Anesthesia: General | Site: Ureter | Laterality: Right

## 2020-04-10 MED ORDER — IOHEXOL 180 MG/ML  SOLN
INTRAMUSCULAR | Status: DC | PRN
  Administered 2020-04-10: 20 mL

## 2020-04-10 MED ORDER — DEXAMETHASONE SODIUM PHOSPHATE 10 MG/ML IJ SOLN
INTRAMUSCULAR | Status: AC
  Filled 2020-04-10: qty 1

## 2020-04-10 MED ORDER — ACETAMINOPHEN 10 MG/ML IV SOLN
INTRAVENOUS | Status: AC
  Filled 2020-04-10: qty 100

## 2020-04-10 MED ORDER — EPHEDRINE 5 MG/ML INJ
INTRAVENOUS | Status: AC
  Filled 2020-04-10: qty 10

## 2020-04-10 MED ORDER — ONDANSETRON HCL 4 MG/2ML IJ SOLN
INTRAMUSCULAR | Status: DC | PRN
  Administered 2020-04-10: 4 mg via INTRAVENOUS

## 2020-04-10 MED ORDER — DEXAMETHASONE SODIUM PHOSPHATE 10 MG/ML IJ SOLN
INTRAMUSCULAR | Status: DC | PRN
  Administered 2020-04-10: 10 mg via INTRAVENOUS

## 2020-04-10 MED ORDER — FAMOTIDINE 20 MG PO TABS
ORAL_TABLET | ORAL | Status: AC
  Administered 2020-04-10: 20 mg via ORAL
  Filled 2020-04-10: qty 1

## 2020-04-10 MED ORDER — FENTANYL CITRATE (PF) 100 MCG/2ML IJ SOLN
INTRAMUSCULAR | Status: AC
  Filled 2020-04-10: qty 2

## 2020-04-10 MED ORDER — GLYCOPYRROLATE 0.2 MG/ML IJ SOLN
INTRAMUSCULAR | Status: AC
  Filled 2020-04-10: qty 1

## 2020-04-10 MED ORDER — KETOROLAC TROMETHAMINE 30 MG/ML IJ SOLN
INTRAMUSCULAR | Status: DC | PRN
  Administered 2020-04-10: 15 mg via INTRAVENOUS

## 2020-04-10 MED ORDER — BELLADONNA ALKALOIDS-OPIUM 16.2-60 MG RE SUPP
RECTAL | Status: AC
  Filled 2020-04-10: qty 1

## 2020-04-10 MED ORDER — FENTANYL CITRATE (PF) 100 MCG/2ML IJ SOLN
INTRAMUSCULAR | Status: DC | PRN
  Administered 2020-04-10: 25 ug via INTRAVENOUS
  Administered 2020-04-10: 50 ug via INTRAVENOUS
  Administered 2020-04-10: 25 ug via INTRAVENOUS

## 2020-04-10 MED ORDER — MIDAZOLAM HCL 2 MG/2ML IJ SOLN
INTRAMUSCULAR | Status: AC
  Filled 2020-04-10: qty 2

## 2020-04-10 MED ORDER — CHLORHEXIDINE GLUCONATE 0.12 % MT SOLN: 15.0000 mL | Freq: Once | OROMUCOSAL | Status: AC

## 2020-04-10 MED ORDER — ONDANSETRON HCL 4 MG/2ML IJ SOLN: 4.0000 mg | Freq: Once | INTRAMUSCULAR | Status: DC | PRN

## 2020-04-10 MED ORDER — CHLORHEXIDINE GLUCONATE 0.12 % MT SOLN
OROMUCOSAL | Status: AC
  Administered 2020-04-10: 15 mL via OROMUCOSAL
  Filled 2020-04-10: qty 15

## 2020-04-10 MED ORDER — LIDOCAINE HCL (PF) 2 % IJ SOLN
INTRAMUSCULAR | Status: AC
  Filled 2020-04-10: qty 5

## 2020-04-10 MED ORDER — EPHEDRINE SULFATE 50 MG/ML IJ SOLN
INTRAMUSCULAR | Status: DC | PRN
  Administered 2020-04-10 (×2): 10 mg via INTRAVENOUS

## 2020-04-10 MED ORDER — FENTANYL CITRATE (PF) 100 MCG/2ML IJ SOLN: 25.0000 ug | INTRAMUSCULAR | Status: DC | PRN

## 2020-04-10 MED ORDER — LIDOCAINE HCL (CARDIAC) PF 100 MG/5ML IV SOSY
PREFILLED_SYRINGE | INTRAVENOUS | Status: DC | PRN
  Administered 2020-04-10: 100 mg via INTRAVENOUS

## 2020-04-10 MED ORDER — PHENYLEPHRINE HCL (PRESSORS) 10 MG/ML IV SOLN
INTRAVENOUS | Status: DC | PRN
  Administered 2020-04-10 (×5): 100 ug via INTRAVENOUS

## 2020-04-10 MED ORDER — ACETAMINOPHEN 10 MG/ML IV SOLN
INTRAVENOUS | Status: DC | PRN
  Administered 2020-04-10: 1000 mg via INTRAVENOUS

## 2020-04-10 MED ORDER — LACTATED RINGERS IV SOLN: INTRAVENOUS | Status: DC

## 2020-04-10 MED ORDER — ORAL CARE MOUTH RINSE: 15.0000 mL | Freq: Once | OROMUCOSAL | Status: AC

## 2020-04-10 MED ORDER — OXYCODONE-ACETAMINOPHEN 5-325 MG PO TABS
1.0000 | ORAL_TABLET | Freq: Four times a day (QID) | ORAL | 0 refills | Status: DC | PRN
Start: 2020-04-10 — End: 2020-07-06

## 2020-04-10 MED ORDER — TAMSULOSIN HCL 0.4 MG PO CAPS: 0.4000 mg | ORAL_CAPSULE | Freq: Every day | ORAL | 0 refills | Status: DC

## 2020-04-10 MED ORDER — SUCCINYLCHOLINE CHLORIDE 200 MG/10ML IV SOSY
PREFILLED_SYRINGE | INTRAVENOUS | Status: AC
  Filled 2020-04-10: qty 10

## 2020-04-10 MED ORDER — PHENYLEPHRINE HCL (PRESSORS) 10 MG/ML IV SOLN
INTRAVENOUS | Status: AC
  Filled 2020-04-10: qty 1

## 2020-04-10 MED ORDER — SUCCINYLCHOLINE CHLORIDE 20 MG/ML IJ SOLN
INTRAMUSCULAR | Status: DC | PRN
  Administered 2020-04-10 (×2): 100 mg via INTRAVENOUS

## 2020-04-10 MED ORDER — CEFAZOLIN SODIUM-DEXTROSE 2-4 GM/100ML-% IV SOLN
2.0000 g | INTRAVENOUS | Status: AC
  Administered 2020-04-10: 2 g via INTRAVENOUS

## 2020-04-10 MED ORDER — FAMOTIDINE 20 MG PO TABS: 20.0000 mg | ORAL_TABLET | Freq: Once | ORAL | Status: AC

## 2020-04-10 MED ORDER — GLYCOPYRROLATE 0.2 MG/ML IJ SOLN
INTRAMUSCULAR | Status: DC | PRN
  Administered 2020-04-10: .2 mg via INTRAVENOUS

## 2020-04-10 MED ORDER — OXYBUTYNIN CHLORIDE 5 MG PO TABS: ORAL_TABLET | ORAL | 0 refills | Status: DC

## 2020-04-10 MED ORDER — ONDANSETRON HCL 4 MG/2ML IJ SOLN
INTRAMUSCULAR | Status: AC
  Filled 2020-04-10: qty 2

## 2020-04-10 MED ORDER — PROPOFOL 10 MG/ML IV BOLUS
INTRAVENOUS | Status: DC | PRN
  Administered 2020-04-10 (×2): 50 mg via INTRAVENOUS
  Administered 2020-04-10: 120 mg via INTRAVENOUS
  Administered 2020-04-10: 50 mg via INTRAVENOUS

## 2020-04-10 MED ORDER — PROPOFOL 10 MG/ML IV BOLUS
INTRAVENOUS | Status: AC
  Filled 2020-04-10: qty 40

## 2020-04-10 MED ORDER — CEFAZOLIN SODIUM-DEXTROSE 2-4 GM/100ML-% IV SOLN
INTRAVENOUS | Status: AC
  Filled 2020-04-10: qty 100

## 2020-04-10 SURGICAL SUPPLY — 46 items
BAG DRAIN CYSTO-URO LG1000N (MISCELLANEOUS) ×4 IMPLANT
BAG DRN RND TRDRP ANRFLXCHMBR (UROLOGICAL SUPPLIES)
BAG URINE DRAIN 2000ML AR STRL (UROLOGICAL SUPPLIES) IMPLANT
BASKET ZERO TIP 1.9FR (BASKET) ×4 IMPLANT
BRUSH SCRUB EZ 1% IODOPHOR (MISCELLANEOUS) ×4 IMPLANT
BSKT STON RTRVL ZERO TP 1.9FR (BASKET) ×2
CATH FOL 2WAY LX 18X30 (CATHETERS) IMPLANT
CATH URETL 5X70 OPEN END (CATHETERS) ×4 IMPLANT
CNTNR SPEC 2.5X3XGRAD LEK (MISCELLANEOUS) ×2
CONT SPEC 4OZ STER OR WHT (MISCELLANEOUS) ×2
CONT SPEC 4OZ STRL OR WHT (MISCELLANEOUS) ×2
CONTAINER SPEC 2.5X3XGRAD LEK (MISCELLANEOUS) ×2 IMPLANT
DRAPE UTILITY 15X26 TOWEL STRL (DRAPES) ×4 IMPLANT
ELECT BIVAP BIPO 22/24 DONUT (ELECTROSURGICAL) ×4
ELECTRD BIVAP BIPO 22/24 DONUT (ELECTROSURGICAL) ×2 IMPLANT
FIBER LASER FLEXIVA 1000 (UROLOGICAL SUPPLIES) IMPLANT
GLOVE BIOGEL PI IND STRL 7.5 (GLOVE) ×2 IMPLANT
GLOVE BIOGEL PI INDICATOR 7.5 (GLOVE) ×2
GOWN STRL REUS W/ TWL LRG LVL3 (GOWN DISPOSABLE) ×4 IMPLANT
GOWN STRL REUS W/ TWL XL LVL3 (GOWN DISPOSABLE) ×2 IMPLANT
GOWN STRL REUS W/TWL LRG LVL3 (GOWN DISPOSABLE) ×8
GOWN STRL REUS W/TWL XL LVL3 (GOWN DISPOSABLE) ×4
GUIDEWIRE STR DUAL SENSOR (WIRE) ×4 IMPLANT
INFUSOR MANOMETER BAG 3000ML (MISCELLANEOUS) ×4 IMPLANT
INTRODUCER DILATOR DOUBLE (INTRODUCER) IMPLANT
IV NS IRRIG 3000ML ARTHROMATIC (IV SOLUTION) ×8 IMPLANT
KIT PROBE TRILOGY 3.9X350 (MISCELLANEOUS) IMPLANT
KIT TURNOVER CYSTO (KITS) ×4 IMPLANT
LASER FIBER FLEXIV TRACTIP 200 (Laser) ×4 IMPLANT
PACK CYSTO AR (MISCELLANEOUS) ×4 IMPLANT
SET CYSTO W/LG BORE CLAMP LF (SET/KITS/TRAYS/PACK) ×4 IMPLANT
SET IRRIG Y TYPE TUR BLADDER L (SET/KITS/TRAYS/PACK) ×4 IMPLANT
SHEATH URETERAL 12FRX35CM (MISCELLANEOUS) IMPLANT
SOL .9 NS 3000ML IRR  AL (IV SOLUTION) ×2
SOL .9 NS 3000ML IRR AL (IV SOLUTION) ×2
SOL .9 NS 3000ML IRR UROMATIC (IV SOLUTION) ×2 IMPLANT
STENT URET 6FRX24 CONTOUR (STENTS) ×4 IMPLANT
STENT URET 6FRX26 CONTOUR (STENTS) IMPLANT
SURGILUBE 2OZ TUBE FLIPTOP (MISCELLANEOUS) ×4 IMPLANT
SYR TOOMEY IRRIG 70ML (MISCELLANEOUS) ×4
SYRINGE TOOMEY IRRIG 70ML (MISCELLANEOUS) ×2 IMPLANT
TRACTIP FLEXIVA PULS ID 200XHI (Laser) ×2 IMPLANT
TRACTIP FLEXIVA PULSE ID 200 (Laser) ×4
VALVE UROSEAL ADJ ENDO (VALVE) IMPLANT
WATER STERILE IRR 1000ML POUR (IV SOLUTION) ×4 IMPLANT
WATER STERILE IRR 3000ML UROMA (IV SOLUTION) IMPLANT

## 2020-04-10 NOTE — Op Note (Signed)
Preoperative diagnosis:  1. Right distal ureteral calculus 2. Bladder calculi  Postoperative diagnosis:  1. Same  Procedure:  1. Cystoscopy 2. Right ureteroscopy and stone removal 3. Ureteroscopic laser lithotripsy 4. Right ureteral stent placement (6FR/24 cm) 5. Right retrograde pyelography with interpretation 6. Cystolitholapaxy < 2.5 cm  Surgeon: Lorin Picket C. Twylah Bennetts, M.D.  Anesthesia: General  Complications: None  Intraoperative findings:  1. Cystoscopy: Wide caliber bulbar stricture; prostate nonocclusive with mild bladder neck elevation; 2 elongated calculi in bladder measuring approximately 15 mm with smaller <5 mm calculi.  One was free-floating and encrustation of a UroLift implant the second was fixed to the bladder neck and was also encrusted UroLift implant which was removed in toto.  Bladder mucosa without erythema, solid or papillary lesions 2. Ureteroscopy: Distal ureteral calculus 3. Right retrograde pyelogram post procedure demonstrates no filling defect or extravasation  EBL: Minimal  Specimens: 1. Ureteral calculus fragments for analysis   Indication: Thomas Olson is a 69 y.o. year old patient with a recent episode of gross hematuria.  He was found on CT urogram to have an 8 mm nonobstructing right ureteral calculus and bladder calculi.  He recently has developed mild right flank pain.  After reviewing the management options for treatment, the patient elected to proceed with the above surgical procedure(s). We have discussed the potential benefits and risks of the procedure, side effects of the proposed treatment, the likelihood of the patient achieving the goals of the procedure, and any potential problems that might occur during the procedure or recuperation. Informed consent has been obtained.  Description of procedure:  The patient was taken to the operating room and general anesthesia was induced.  The patient was placed in the dorsal lithotomy position,  prepped and draped in the usual sterile fashion, and preoperative antibiotics were administered. A preoperative time-out was performed.   A 21 French cystoscope was lubricated and passed under direct vision.  The scope was advanced into the bladder with findings as described above.  Attention was directed to the right ureteral orifice and a 0.038 Sensor wire was then advanced up the ureter into the renal pelvis under fluoroscopic guidance.  A 4.5 Fr semirigid ureteroscope was then advanced into the ureter next to the guidewire and the calculus was identified.  The stone was then fragmented with a 200 micron holmium laser fiber on a setting of *0.8 J and frequency of 8 hz.   All fragments were then removed from the ureter with a zero tip nitinol basket.  Reinspection of the ureter revealed no remaining visible stones or fragments.   Retrograde pyelogram was performed with findings as described above.  A 6FR/24 cm Contour ureteral stent was then placed over the guidewire under fluoroscopic guidance with good proximal and distal positioning noted.  A 24 French continuous-flow resectoscope sheath with a laser bridge was then advanced into the bladder without difficulty.  The ureteral fragments were drained by irrigation and sent for analysis.  The free-floating elongated stone was then fragmented with the 200 m holmium laser fiber at settings of 0.4 J / 40 Hz.  This was found to be an encrusted UroLift implant as described above.  The second calcification was adherent to the left bladder neck at 3 o'clock position.  It was placed with graspers and tried to work free and the urethral tab of a UroLift implant was exposed at the distal bladder neck.  The calculus was treated with laser until completely freed from the UroLift implant.  The UroLift implant was subsequently removed with a grasper.  The remaining fragments were then irrigated from the bladder.  Oozing was noted at the site of the removed  UroLift implant and an Iglesias resectoscope with button electrode was then placed into the sheath and the site was fulgurated with good hemostasis.   Gross was closely inspected.  The stent was in good position.  No additional foreign bodies or bladder calcifications were identified.  The bladder was then emptied and the procedure ended.  An 61 French Foley catheter was placed with return of clear effluent upon irrigation.  Mild urethral oozing was noted.  The patient appeared to tolerate the procedure well and without complications.  After anesthetic reversal the patient was transported to the PACU in stable condition.   Plan: He is scheduled for cystoscopy with stent removal on 04/18/2020  [name], MD

## 2020-04-10 NOTE — Anesthesia Procedure Notes (Signed)
Procedure Name: Intubation Date/Time: 04/10/2020 9:42 AM Performed by: Debe Coder, CRNA Pre-anesthesia Checklist: Patient identified, Emergency Drugs available, Suction available and Patient being monitored Oxygen Delivery Method: Circle system utilized Induction Type: IV induction Ventilation: Unable to mask ventilate Laryngoscope Size: Mac and 4 Grade View: Grade IV Tube type: Oral Tube size: 7.5 mm Number of attempts: 1 Airway Equipment and Method: Stylet and Oral airway (ALP) Placement Confirmation: ETT inserted through vocal cords under direct vision,  positive ETCO2 and breath sounds checked- equal and bilateral Secured at: 24 cm Tube secured with: Tape Dental Injury: Teeth and Oropharynx as per pre-operative assessment  Difficulty Due To: Difficulty was unanticipated Future Recommendations: Recommend- induction with short-acting agent, and alternative techniques readily available Comments: Recommend video laryngoscope for future intubations

## 2020-04-10 NOTE — Discharge Instructions (Signed)
AMBULATORY SURGERY  DISCHARGE INSTRUCTIONS   1) The drugs that you were given will stay in your system until tomorrow so for the next 24 hours you should not:  A) Drive an automobile B) Make any legal decisions C) Drink any alcoholic beverage   2) You may resume regular meals tomorrow.  Today it is better to start with liquids and gradually work up to solid foods.  You may eat anything you prefer, but it is better to start with liquids, then soup and crackers, and gradually work up to solid foods.   3) Please notify your doctor immediately if you have any unusual bleeding, trouble breathing, redness and pain at the surgery site, drainage, fever, or pain not relieved by medication.    4) Additional Instructions:        Please contact your physician with any problems or Same Day Surgery at [phone], Monday through Friday 6 am to 4 pm, or [place] [facility] number at [phone].DISCHARGE INSTRUCTIONS FOR KIDNEY STONE/URETERAL STENT   MEDICATIONS:  1. Resume all your other meds from home.  2.  AZO (over-the-counter) can help with the burning/stinging when you urinate. 3.  Oxycodone is for moderate/severe pain, Rx was sent to your pharmacy. 4.  Tamsulosin and oxybutynin are for stent irritation, prescriptions were sent to pharmacy   ACTIVITY:  1. May resume regular activities in 24 hours. 2. No driving while on narcotic pain medications  3. Drink plenty of water  4. Continue to walk at home - you can still get blood clots when you are at home, so keep active, but don't over do it.  5. May return to work/school tomorrow or when you feel ready   BATHING:  1. You can shower.  SIGNS/SYMPTOMS TO CALL:  Please call us if you have a fever greater than 101.5, uncontrolled nausea/vomiting, uncontrolled pain, dizziness, unable to urinate, excessively bloody urine, chest pain, shortness of breath, leg swelling, leg pain, or any other concerns or questions.   Common  postoperative symptoms include urinary frequency, urgency, blood in urine and bladder irritation  You can reach [place] at [phone].   FOLLOW-UP:   You are scheduled for cystoscopy with stent removal on 04/18/2020 at 11:15.  Please arrive at 11:00

## 2020-04-10 NOTE — Anesthesia Postprocedure Evaluation (Signed)
Anesthesia Post Note  Patient: Thomas Olson  Procedure(s) Performed: CYSTOSCOPY/URETEROSCOPY/HOLMIUM LASER/STENT PLACEMENT (Right Ureter) CYSTOSCOPY WITH LITHOLAPAXY (N/A Bladder) CYSTOSCOPY WITH RETROGRADE PYELOGRAM (Right Ureter)  Patient location during evaluation: PACU Anesthesia Type: General Level of consciousness: awake and alert Pain management: pain level controlled Vital Signs Assessment: post-procedure vital signs reviewed and stable Respiratory status: spontaneous breathing, nonlabored ventilation, respiratory function stable and patient connected to nasal cannula oxygen Cardiovascular status: blood pressure returned to baseline and stable Postop Assessment: no apparent nausea or vomiting Anesthetic complications: no   No complications documented.   Last Vitals:  Vitals:   04/10/20 1230 04/10/20 1245  BP:    Pulse: 62 65  Resp: 14 13  Temp:  (!) 36.1 C  SpO2: 98% 97%    Last Pain:  Vitals:   04/10/20 1230  TempSrc:   PainSc: 0-No pain                 Corinda Gubler

## 2020-04-10 NOTE — Anesthesia Preprocedure Evaluation (Signed)
Anesthesia Evaluation  Patient identified by MRN, date of birth, ID band Patient awake    Reviewed: Allergy & Precautions, NPO status , Patient's Chart, lab work & pertinent test results  History of Anesthesia Complications Negative for: history of anesthetic complications  Airway Mallampati: II  TM Distance: >3 FB Neck ROM: Full    Dental no notable dental hx.    Pulmonary sleep apnea (does not have CPAP, states he didn't want it) , neg COPD,    breath sounds clear to auscultation- rhonchi (-) wheezing      Cardiovascular (-) hypertension(-) CAD, (-) Past MI, (-) Cardiac Stents and (-) CABG  Rhythm:Regular Rate:Normal - Systolic murmurs and - Diastolic murmurs    Neuro/Psych neg Seizures PSYCHIATRIC DISORDERS Depression CVA, No Residual Symptoms    GI/Hepatic negative GI ROS, Neg liver ROS,   Endo/Other  negative endocrine ROSneg diabetes  Renal/GU Renal disease: nephrolithiasis.     Musculoskeletal negative musculoskeletal ROS (+)   Abdominal (+) - obese,   Peds  Hematology negative hematology ROS (+)   Anesthesia Other Findings Past Medical History: No date: CVA (cerebral vascular accident) (HCC)     Comment:  08/2015, no residuals No date: Depression No date: History of kidney stones No date: Kidney stones No date: Sleep apnea     Comment:  no cpap    Reproductive/Obstetrics                             Anesthesia Physical Anesthesia Plan  ASA: II  Anesthesia Plan: General   Post-op Pain Management:    Induction: Intravenous  PONV Risk Score and Plan: 1 and Ondansetron and Dexamethasone  Airway Management Planned: Oral ETT  Additional Equipment:   Intra-op Plan:   Post-operative Plan: Extubation in OR  Informed Consent: I have reviewed the patients History and Physical, chart, labs and discussed the procedure including the risks, benefits and alternatives for the  proposed anesthesia with the patient or authorized representative who has indicated his/her understanding and acceptance.     Dental advisory given  Plan Discussed with: CRNA and Anesthesiologist  Anesthesia Plan Comments:         Anesthesia Quick Evaluation

## 2020-04-10 NOTE — Anesthesia Procedure Notes (Signed)
Procedure Name: LMA Insertion Date/Time: 04/10/2020 9:07 AM Performed by: Reece Agar, CRNA Pre-anesthesia Checklist: Patient identified, Patient being monitored, Timeout performed, Suction available and Emergency Drugs available Patient Re-evaluated:Patient Re-evaluated prior to induction Oxygen Delivery Method: Circle system utilized Preoxygenation: Pre-oxygenation with 100% oxygen Induction Type: IV induction Ventilation: Mask ventilation without difficulty LMA: LMA inserted LMA Size: 4.5 Number of attempts: 1 Tube secured with: Tape Dental Injury: Teeth and Oropharynx as per pre-operative assessment

## 2020-04-10 NOTE — Transfer of Care (Signed)
Immediate Anesthesia Transfer of Care Note  Patient: Thomas Olson  Procedure(s) Performed: CYSTOSCOPY/URETEROSCOPY/HOLMIUM LASER/STENT PLACEMENT (Right Ureter) CYSTOSCOPY WITH LITHOLAPAXY (N/A Bladder) CYSTOSCOPY WITH RETROGRADE PYELOGRAM (Right Ureter)  Patient Location: PACU  Anesthesia Type:General  Level of Consciousness: awake, drowsy and patient cooperative  Airway & Oxygen Therapy: Patient Spontanous Breathing and Patient connected to face mask oxygen  Post-op Assessment: Report given to RN and Post -op Vital signs reviewed and stable  Post vital signs: Reviewed and stable  Last Vitals:  Vitals Value Taken Time  BP 124/66 04/10/20 1132  Temp    Pulse 77 04/10/20 1136  Resp 16 04/10/20 1136  SpO2 100 % 04/10/20 1136  Vitals shown include unvalidated device data.  Last Pain:  Vitals:   04/10/20 1135  TempSrc:   PainSc: (P) 0-No pain      Patients Stated Pain Goal: 3 (04/10/20 0707)  Complications: No complications documented.

## 2020-04-10 NOTE — Interval H&P Note (Signed)
History and Physical Interval Note:  04/10/2020 7:51 AM  Thomas Olson  has presented today for surgery, with the diagnosis of right ureteral calculus, bladder calculi.  The various methods of treatment have been discussed with the patient and family. After consideration of risks, benefits and other options for treatment, the patient has consented to  Procedure(s): CYSTOSCOPY/URETEROSCOPY/HOLMIUM LASER/STENT PLACEMENT (Right) CYSTOSCOPY WITH LITHOLAPAXY (N/A) as a surgical intervention.  The patient's history has been reviewed, patient examined, no change in status, stable for surgery.  I have reviewed the patient's chart and labs.  Questions were answered to the patient's satisfaction.     Palmyra Rogacki C Keighan Amezcua

## 2020-04-10 NOTE — Progress Notes (Signed)
Dr Virl Diamond aware urine is bloody but clear, oked to dc foley

## 2020-04-11 ENCOUNTER — Encounter: Payer: Self-pay | Admitting: Urology

## 2020-04-14 LAB — CALCULI, WITH PHOTOGRAPH (CLINICAL LAB)
Calcium Oxalate Dihydrate: 10 %
Calcium Oxalate Monohydrate: 80 %
Uric Acid Calculi: 10 %
Weight Calculi: 88 mg

## 2020-04-16 ENCOUNTER — Other Ambulatory Visit: Payer: Medicare Other

## 2020-04-18 ENCOUNTER — Encounter: Payer: Self-pay | Admitting: Urology

## 2020-04-18 ENCOUNTER — Ambulatory Visit (INDEPENDENT_AMBULATORY_CARE_PROVIDER_SITE_OTHER): Payer: Medicare Other | Admitting: Urology

## 2020-04-18 ENCOUNTER — Other Ambulatory Visit: Payer: Self-pay

## 2020-04-18 VITALS — BP 102/68 | HR 57 | Ht 67.0 in | Wt 175.0 lb

## 2020-04-18 DIAGNOSIS — N201 Calculus of ureter: Secondary | ICD-10-CM | POA: Diagnosis not present

## 2020-04-18 MED ORDER — CIPROFLOXACIN HCL 500 MG PO TABS
500.0000 mg | ORAL_TABLET | Freq: Once | ORAL | Status: AC
  Administered 2020-04-18: 500 mg via ORAL

## 2020-04-18 NOTE — Progress Notes (Signed)
Indications: Patient is 69 y.o. s/p cystolitholapaxy and ureteroscopic removal of a 8 mm right distal ureteral calculus on 04/10/2020.  No significant postop problems but does have moderate stent symptoms.  The patient is presenting today for stent removal.  Procedure:  Flexible Cystoscopy with stent removal (37858)  Timeout was performed and the correct patient, procedure and participants were identified.    Description:  The patient was prepped and draped in the usual sterile fashion. Flexible cystosopy was performed.  The stent was visualized, grasped, and removed intact without difficulty. The patient tolerated the procedure well.  A single dose of oral antibiotics was given.  Complications:  None  Plan:   Call for fever/flank pain post stent removal  6-week follow-up

## 2020-04-19 ENCOUNTER — Encounter: Payer: Self-pay | Admitting: Urology

## 2020-04-19 LAB — MICROSCOPIC EXAMINATION
RBC, Urine: >30 /hpf — AB (ref 0–2)
Renal Epithel, UA: NONE SEEN /hpf

## 2020-04-19 LAB — URINALYSIS, COMPLETE
Bilirubin, UA: NEGATIVE
Ketones, UA: NEGATIVE
Nitrite, UA: NEGATIVE
Specific Gravity, UA: >1.03 — ABNORMAL HIGH (ref 1.005–1.030)
Urobilinogen, Ur: 0.2 mg/dL (ref 0.2–1.0)
pH, UA: 5 (ref 5.0–7.5)

## 2020-06-01 ENCOUNTER — Ambulatory Visit: Payer: Self-pay | Admitting: Urology

## 2020-07-06 ENCOUNTER — Ambulatory Visit (INDEPENDENT_AMBULATORY_CARE_PROVIDER_SITE_OTHER): Payer: Medicare Other | Admitting: Urology

## 2020-07-06 ENCOUNTER — Encounter: Payer: Self-pay | Admitting: Urology

## 2020-07-06 ENCOUNTER — Other Ambulatory Visit: Payer: Self-pay

## 2020-07-06 VITALS — BP 128/73 | HR 56 | Ht 67.0 in | Wt 175.0 lb

## 2020-07-06 DIAGNOSIS — N401 Enlarged prostate with lower urinary tract symptoms: Secondary | ICD-10-CM

## 2020-07-06 DIAGNOSIS — N200 Calculus of kidney: Secondary | ICD-10-CM | POA: Diagnosis not present

## 2020-07-06 NOTE — Patient Instructions (Signed)

## 2020-07-06 NOTE — Progress Notes (Signed)
07/06/2020 8:40 AM   Thomas Olson [birth year] [id number]  Referring provider: Cain Sieve, MD [address] [place],  [place] [address]  Chief Complaint  Patient presents with  . Nephrolithiasis    Urologic history: 1.  BPH with LUTS -Severe lower urinary tract symptoms refractory to medical management -UroLift 02/2018 -Cystolitholapaxy 03/2020 for encrusted proximal implants  2.  Recurrent stone disease -Prior ureteroscopic stone removal/ESWL -Last procedure 03/2020 for 8 mm right distal ureteral calculus -CT 02/2020 with bilateral, nonobstructing renal calculi in addition to the 8 mm right distal calculus   HPI: 70 y.o. male presents for follow-up visit.   No problems after stent removal 04/18/2020  Stone analysis CaOxMono/CaOxDi/uric acid 80/10/10  Voiding back to baseline and states he is doing much better since UroLift  Does not remember doing a prior metabolic evaluation   PMH: Past Medical History:  Diagnosis Date  . CVA (cerebral vascular accident) (HCC)    08/2015, no residuals  . Depression   . History of kidney stones   . Kidney stones   . Sleep apnea    no cpap     Surgical History: Past Surgical History:  Procedure Laterality Date  . CYSTOSCOPY    . CYSTOSCOPY W/ RETROGRADES Right 04/10/2020   Procedure: CYSTOSCOPY WITH RETROGRADE PYELOGRAM;  Surgeon: Riki Altes, MD;  Location: [facility];  Service: Urology;  Laterality: Right;  . CYSTOSCOPY WITH INSERTION OF UROLIFT N/A 02/12/2018   Procedure: CYSTOSCOPY WITH INSERTION OF UROLIFT;  Surgeon: Riki Altes, MD;  Location: [facility];  Service: Urology;  Laterality: N/A;  . CYSTOSCOPY WITH LITHOLAPAXY N/A 02/12/2018   Procedure: CYSTOSCOPY WITH LITHOLAPAXY;  Surgeon: Riki Altes, MD;  Location: [facility];  Service: Urology;  Laterality: N/A;  . CYSTOSCOPY WITH LITHOLAPAXY N/A 04/10/2020   Procedure: CYSTOSCOPY WITH LITHOLAPAXY;  Surgeon: Riki Altes, MD;   Location: [facility];  Service: Urology;  Laterality: N/A;  . CYSTOSCOPY/URETEROSCOPY/HOLMIUM LASER/STENT PLACEMENT Right 04/10/2020   Procedure: CYSTOSCOPY/URETEROSCOPY/HOLMIUM LASER/STENT PLACEMENT;  Surgeon: Riki Altes, MD;  Location: [facility];  Service: Urology;  Laterality: Right;  . HOLMIUM LASER APPLICATION N/A 02/12/2018   Procedure: HOLMIUM LASER APPLICATION;  Surgeon: Riki Altes, MD;  Location: [facility];  Service: Urology;  Laterality: N/A;  . LITHOTRIPSY      Home Medications:  Allergies as of 07/06/2020      Reactions   Dilaudid [hydromorphone Hcl] Anaphylaxis      Medication List       Accurate as of July 06, 2020  8:40 AM. If you have any questions, ask your nurse or doctor.        STOP taking these medications   oxybutynin 5 MG tablet Commonly known as: DITROPAN Stopped by: Riki Altes, MD   oxyCODONE-acetaminophen 5-325 MG tablet Commonly known as: PERCOCET/ROXICET Stopped by: Riki Altes, MD   tamsulosin 0.4 MG Caps capsule Commonly known as: FLOMAX Stopped by: Riki Altes, MD     TAKE these medications   aspirin EC 81 MG tablet Take 81 mg by mouth daily.   atorvastatin 80 MG tablet Commonly known as: LIPITOR Take 80 mg by mouth daily.   citalopram 20 MG tablet Commonly known as: CELEXA Take 20 mg by mouth daily.       Allergies:  Allergies  Allergen Reactions  . Dilaudid [Hydromorphone Hcl] Anaphylaxis    Family History: History reviewed. No pertinent family history.  Social History:  reports that he has  never smoked. He has never used smokeless tobacco. He reports that he does not drink alcohol and does not use drugs.   Physical Exam: BP 128/73   Pulse (!) 56   Ht 5\' 7"  (1.702 m)   Wt 175 lb (79.4 kg)   BMI 27.41 kg/m   Constitutional:  Alert and oriented, No acute distress. HEENT: [place] AT, moist mucus membranes.  Trachea midline, no masses. Cardiovascular: No clubbing, cyanosis, or edema. Respiratory:  Normal respiratory effort, no increased work of breathing. Skin: No rashes, bruises or suspicious lesions. Neurologic: Grossly intact, no focal deficits, moving all 4 extremities. Psychiatric: Normal mood and affect.   Assessment & Plan:    1.  Nephrolithiasis  He would like to proceed with a metabolic evaluation but wants to wait at least 3 months.  [employer] order placed and will call with results  1 year follow-up with [facility]  2.  BPH with LUTS  Doing well status post UroLift   , MD  [facility] [address] [place], [place] [id number]

## 2020-11-29 SURGERY — Surgical Case
Anesthesia: *Unknown

## 2021-06-13 IMAGING — CR DG ABDOMEN 1V
1 series · 2 of 2 positions shown · non-contrast
Comparison: CT 03/20/2020.  Abdomen 01/15/2010.

CLINICAL DATA: Kidney stone.

EXAM:
ABDOMEN - 1 VIEW

[Series 1: dg abd 1 view · 0.14mm/px · 2 of 2 slices shown]
[im 1/2]
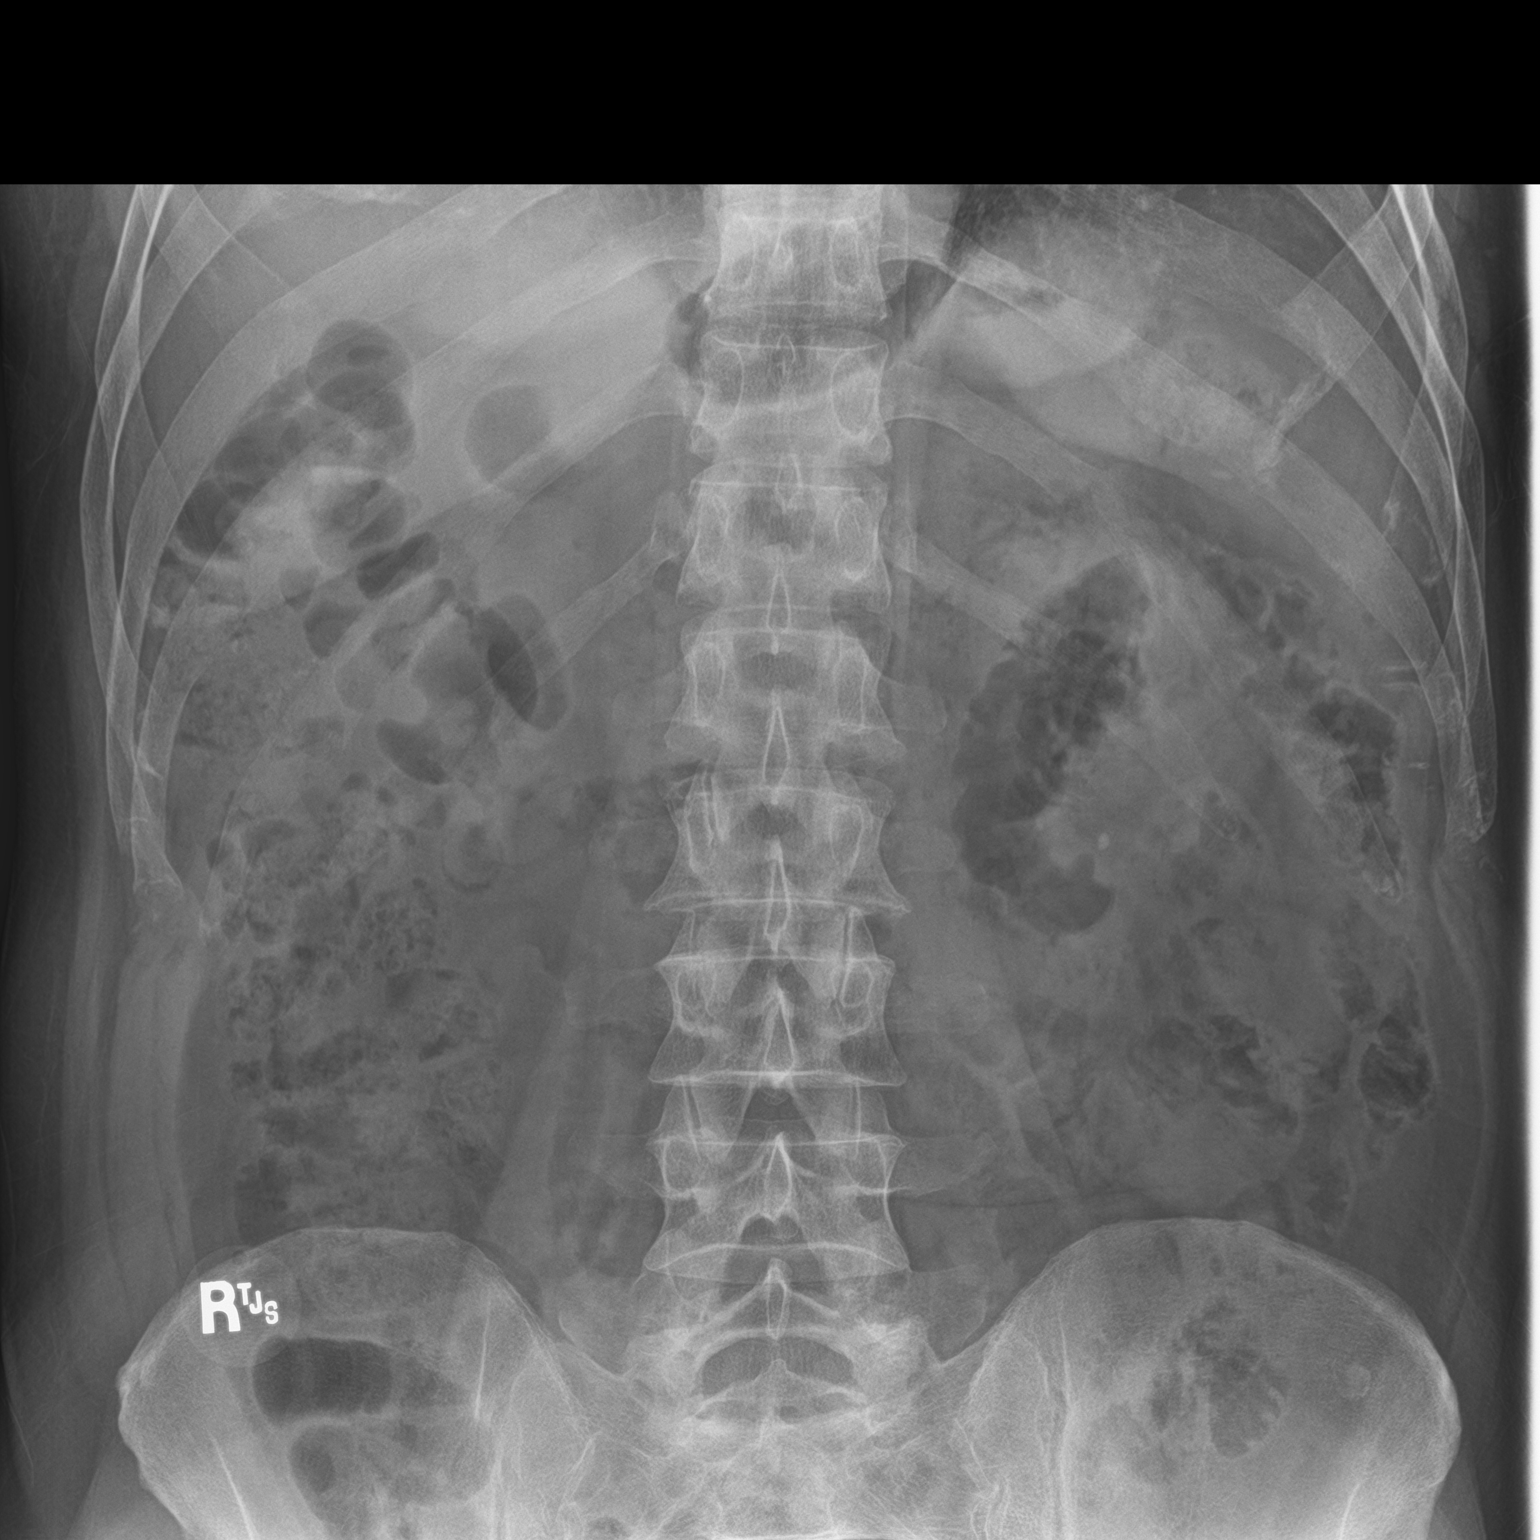
[im 2/2]
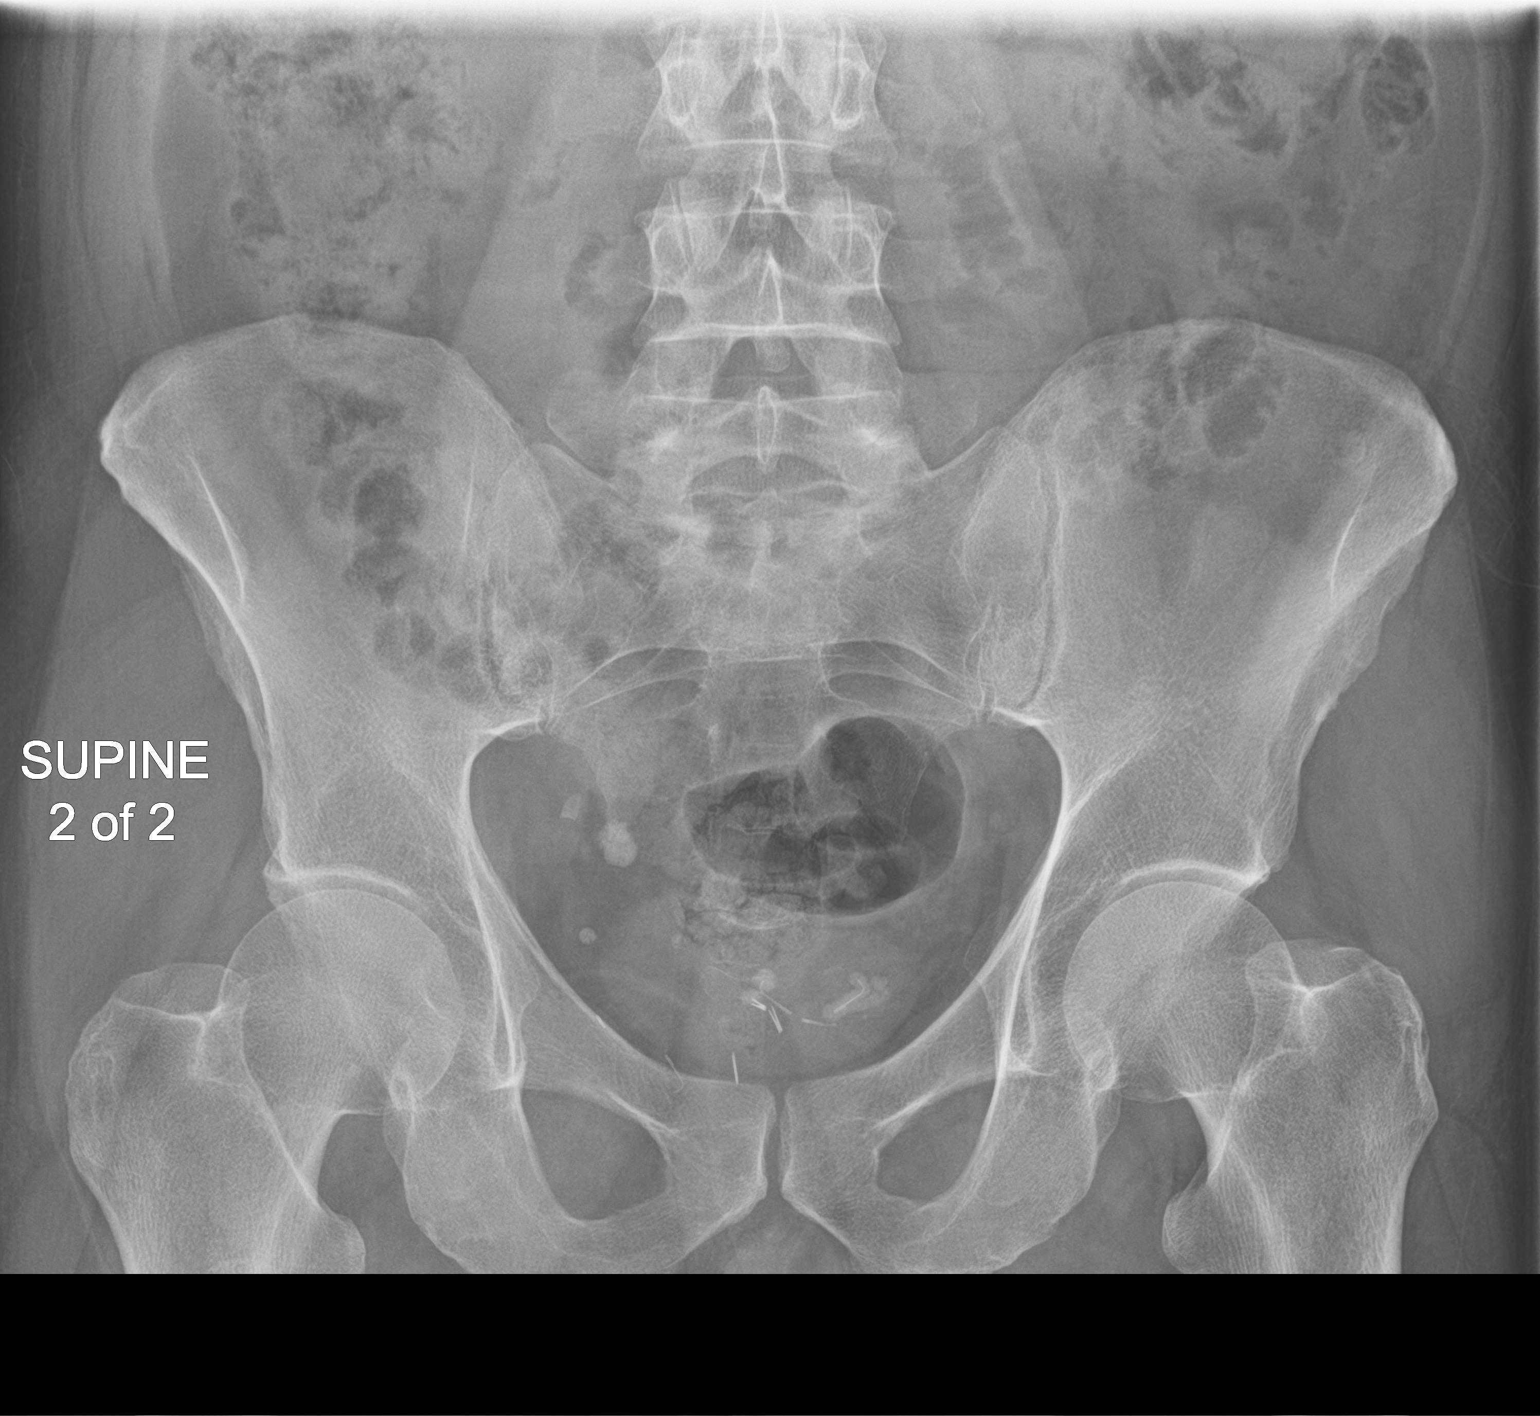

[2 of 2 positions shown; findings below may reference images not displayed]

FINDINGS: Persistent 10 mm stone noted projected over the distal right ureter.
Persistent bladder stones. Phleboliths again noted with out interim
change. Probable left nephrolithiasis again noted. No bowel
distention. Stool noted throughout the colon. No acute bony
abnormality. Mild thoracolumbar spine scoliosis and degenerative
change.
IMPRESSION: 1. Persistent 10 mm stone noted projected over the distal right
ureter.
2. Persistent bladder stones. Probable left nephrolithiasis again
noted.

## 2021-07-01 ENCOUNTER — Ambulatory Visit: Payer: Self-pay | Admitting: Urology

## 2022-07-02 ENCOUNTER — Telehealth: Payer: Self-pay | Admitting: Urology

## 2022-07-02 NOTE — Telephone Encounter (Signed)
Patient was seen in ER at [facility] today and had stent placed. When would you like this to be scheduled to be removed? Notes are in Epic.

## 2022-07-06 NOTE — Telephone Encounter (Signed)
Sched f/u appt this week to discuss stone management options. PA visit is fine

## 2022-07-11 ENCOUNTER — Ambulatory Visit: Payer: Medicare Other | Admitting: Physician Assistant

## 2022-07-11 ENCOUNTER — Telehealth: Payer: Self-pay

## 2022-07-11 ENCOUNTER — Other Ambulatory Visit: Payer: Self-pay | Admitting: Physician Assistant

## 2022-07-11 VITALS — BP 146/79 | HR 49 | Ht 67.0 in | Wt 179.5 lb

## 2022-07-11 DIAGNOSIS — N202 Calculus of kidney with calculus of ureter: Secondary | ICD-10-CM

## 2022-07-11 DIAGNOSIS — N201 Calculus of ureter: Secondary | ICD-10-CM

## 2022-07-11 DIAGNOSIS — N200 Calculus of kidney: Secondary | ICD-10-CM

## 2022-07-11 LAB — URINALYSIS, COMPLETE
Bilirubin, UA: NEGATIVE
Glucose, UA: NEGATIVE
Ketones, UA: NEGATIVE
Nitrite, UA: NEGATIVE
Specific Gravity, UA: 1.025 (ref 1.005–1.030)
Urobilinogen, Ur: 0.2 mg/dL (ref 0.2–1.0)
pH, UA: 5.5 (ref 5.0–7.5)

## 2022-07-11 LAB — MICROSCOPIC EXAMINATION: RBC, Urine: >30 /hpf — AB (ref 0–2)

## 2022-07-11 MED ORDER — POLYETHYLENE GLYCOL 3350 17 GM/SCOOP PO POWD: 17.0000 g | Freq: Every day | ORAL | 0 refills | Status: DC

## 2022-07-11 MED ORDER — OXYBUTYNIN CHLORIDE ER 10 MG PO TB24: 10.0000 mg | ORAL_TABLET | Freq: Every day | ORAL | 0 refills | Status: DC

## 2022-07-11 MED ORDER — TAMSULOSIN HCL 0.4 MG PO CAPS: 0.4000 mg | ORAL_CAPSULE | Freq: Every day | ORAL | 0 refills | Status: AC

## 2022-07-11 MED ORDER — OXYCODONE HCL 5 MG PO TABS: 5.0000 mg | ORAL_TABLET | Freq: Four times a day (QID) | ORAL | 0 refills | Status: DC | PRN

## 2022-07-11 NOTE — Progress Notes (Signed)
07/11/2022 10:04 AM   Thomas Olson February 26, 1951 [id number]  CC: Chief Complaint  Patient presents with   discuss stone management options   HPI: Thomas Olson is a 72 y.o. male with PMH recurrent nephrolithiasis and BPH with LUTS s/p UroLift in 2019 on Flomax who was seen at the [facility] ED 9 days ago with severe left renal colic and leukocytosis secondary to an 11 mm left proximal ureteral stone requiring urgent left ureteral stent placement who presents today to discuss definitive stone management.  He is accompanied today by his wife, who contributes to HPI.  Today he reports left sided stent symptoms, which are typical for him with ureteral stents.  He has been taking Toradol, oxybutynin, oxycodone, and Flomax to manage these and is about to run out of all of them.  He reports bothersome dry mouth, dry sinuses, and constipation.  He denies fever, chills, nausea, or vomiting.  They wonder about stone prevention measures and he admits to an extensive family history of nephrolithiasis and drinking a lot of Pepsi.  Per chart review, his UA at the time of ED presentation was rather bland with no nitrites, no pyuria, no hematuria, and no bacteriuria.  No urine culture results available for review.  Radiology report from his CT stone study describes an 11 mm obstructing left UPJ stone with left perinephric stranding and bilateral nonobstructing renal stones.  In-office UA today positive for 3+ blood, 2+ protein, and 1+ leukocytes; urine microscopy with 6-10 WBCs/HPF, >30 RBCs/HPF, and moderate bacteria.   PMH: Past Medical History:  Diagnosis Date   CVA (cerebral vascular accident) ([place])    08/2015, no residuals   Depression    History of kidney stones    Kidney stones    Sleep apnea    no cpap     Surgical History: Past Surgical History:  Procedure Laterality Date   CYSTOSCOPY     CYSTOSCOPY W/ RETROGRADES Right 04/10/2020   Procedure: CYSTOSCOPY WITH RETROGRADE PYELOGRAM;  Surgeon:  Abbie Sons, MD;  Location: [facility];  Service: Urology;  Laterality: Right;   CYSTOSCOPY WITH INSERTION OF UROLIFT N/A 02/12/2018   Procedure: CYSTOSCOPY WITH INSERTION OF UROLIFT;  Surgeon: Abbie Sons, MD;  Location: [facility];  Service: Urology;  Laterality: N/A;   CYSTOSCOPY WITH LITHOLAPAXY N/A 02/12/2018   Procedure: CYSTOSCOPY WITH LITHOLAPAXY;  Surgeon: Abbie Sons, MD;  Location: [facility];  Service: Urology;  Laterality: N/A;   CYSTOSCOPY WITH LITHOLAPAXY N/A 04/10/2020   Procedure: CYSTOSCOPY WITH LITHOLAPAXY;  Surgeon: Abbie Sons, MD;  Location: [facility];  Service: Urology;  Laterality: N/A;   CYSTOSCOPY/URETEROSCOPY/HOLMIUM LASER/STENT PLACEMENT Right 04/10/2020   Procedure: CYSTOSCOPY/URETEROSCOPY/HOLMIUM LASER/STENT PLACEMENT;  Surgeon: Abbie Sons, MD;  Location: [facility];  Service: Urology;  Laterality: Right;   HOLMIUM LASER APPLICATION N/A 6/75/9163   Procedure: HOLMIUM LASER APPLICATION;  Surgeon: Abbie Sons, MD;  Location: [facility];  Service: Urology;  Laterality: N/A;   LITHOTRIPSY      Home Medications:  Allergies as of 07/11/2022       Reactions   Dilaudid [hydromorphone Hcl] Anaphylaxis        Medication List        Accurate as of July 11, 2022 10:04 AM. If you have any questions, ask your nurse or doctor.          acetaminophen 500 MG tablet Commonly known as: TYLENOL Take 1,000 mg by mouth every 6 (six) hours as needed.  aspirin EC 81 MG tablet Take 81 mg by mouth daily.   atorvastatin 80 MG tablet Commonly known as: LIPITOR Take 80 mg by mouth daily.   citalopram 20 MG tablet Commonly known as: CELEXA Take 20 mg by mouth daily.   docusate sodium 100 MG capsule Commonly known as: COLACE Take 100 mg by mouth daily.   ketorolac 10 MG tablet Commonly known as: TORADOL Take 10 mg by mouth every 6 (six) hours as needed.   oxybutynin 5 MG tablet Commonly known as: DITROPAN Take 5 mg by mouth 3 (three) times  daily as needed.   oxyCODONE 5 MG immediate release tablet Commonly known as: Oxy IR/ROXICODONE Take 5 mg by mouth every 6 (six) hours as needed.   senna 8.6 MG tablet Commonly known as: SENOKOT Take 1 tablet by mouth daily.   tamsulosin 0.4 MG Caps capsule Commonly known as: FLOMAX Take 0.4 mg by mouth daily.        Allergies:  Allergies  Allergen Reactions   Dilaudid [Hydromorphone Hcl] Anaphylaxis    Family History: No family history on file.  Social History:   reports that he has never smoked. He has never used smokeless tobacco. He reports that he does not drink alcohol and does not use drugs.  Physical Exam: BP (!) 146/79   Pulse (!) 49   Ht 5\' 7"  (1.702 m)   Wt 179 lb 8 oz (81.4 kg)   BMI 28.11 kg/m   Constitutional:  Alert and oriented, no acute distress, nontoxic appearing HEENT: [place], AT Cardiovascular: No clubbing, cyanosis, or edema Respiratory: Normal respiratory effort, no increased work of breathing Skin: No rashes, bruises or suspicious lesions Neurologic: Grossly intact, no focal deficits, moving all 4 extremities Psychiatric: Normal mood and affect  Laboratory Data: Results for orders placed or performed in visit on 07/11/22  Microscopic Examination   Urine  Result Value Ref Range   WBC, UA 6-10 (A) 0 - 5 /hpf   RBC, Urine >30 (A) 0 - 2 /hpf   Epithelial Cells (non renal) 0-10 0 - 10 /hpf   Casts Present (A) None seen /lpf   Cast Type Hyaline casts N/A   Mucus, UA Present (A) Not Estab.   Bacteria, UA Moderate (A) None seen/Few  Urinalysis, Complete  Result Value Ref Range   Specific Gravity, UA 1.025 1.005 - 1.030   pH, UA 5.5 5.0 - 7.5   Color, UA Yellow Yellow   Appearance Ur Hazy (A) Clear   Leukocytes,UA 1+ (A) Negative   Protein,UA 2+ (A) Negative/Trace   Glucose, UA Negative Negative   Ketones, UA Negative Negative   RBC, UA 3+ (A) Negative   Bilirubin, UA Negative Negative   Urobilinogen, Ur 0.2 0.2 - 1.0 mg/dL   Nitrite,  UA Negative Negative   Microscopic Examination See below:    Assessment & Plan:   1. Left ureteral stone 11 mm left UPJ stone s/p left ureteral stent placement at [facility] 9 days ago.  Also with bilateral nonobstructing renal stones.  UA today with mild pyuria, microscopic hematuria, and bacteriuria.  He is not clinically infected.  We discussed various treatment options for his stones including ESWL vs. ureteroscopy with laser lithotripsy and stent.  We specifically discussed that ESWL is a less invasive procedure that requires less anesthesia, however patients have to pass their residual stone fragments, which may take several weeks and be associated with continued renal colic. Additionally, we discussed the limitations of ESWL including the ability  to only treat one stone in a single treatment. By comparison, ureteroscopy is a more invasive surgical procedure that requires more anesthesia, but we can potentially treat multiple stones in a single procedure. However, URS does require placement of a ureteral stent, which will remain in place for approximately 3-10 days and can be associated with flank pain, bladder pain, dysuria, urgency, frequency, urinary leakage, and gross hematuria.  Based on this conversation, he would like to proceed with left ureteroscopy with laser lithotripsy and stent exchange.  OR orders placed.  Will plan for next week on Dr. Lonna Cobb schedule.  Will refill oxybutynin, but switch to XL formulation since he is having some pretty marked anticholinergic side effects on the IR formulation.  Also refilling Flomax and oxycodone and starting him on MiraLAX to manage his constipation.  Will send urine for preop culture today.  He is in agreement with this plan. - Urinalysis, Complete - CULTURE, URINE COMPREHENSIVE - oxybutynin (DITROPAN-XL) 10 MG 24 hr tablet; Take 1 tablet (10 mg total) by mouth daily.  Dispense: 30 tablet; Refill: 0 - tamsulosin (FLOMAX) 0.4 MG CAPS capsule; Take  1 capsule (0.4 mg total) by mouth daily.  Dispense: 30 capsule; Refill: 0 - oxyCODONE (OXY IR/ROXICODONE) 5 MG immediate release tablet; Take 1 tablet (5 mg total) by mouth every 6 (six) hours as needed.  Dispense: 16 tablet; Refill: 0 - polyethylene glycol powder (MIRALAX) 17 GM/SCOOP powder; Take 17 g by mouth daily.  Dispense: 255 g; Refill: 0   Return in 4 days (on 07/15/2022) for Left ureteroscopy with laser lithotripsy and stent exchange with Dr. Lonna Cobb.  Carman Ching, PA-C  [facility] [address] [place], [place] [address] [phone]

## 2022-07-11 NOTE — Progress Notes (Signed)
   [facility] From  Surgery Date: Date: 07/15/2022  Surgeon: Dr. John Giovanni, MD  Inpt ( No  )   Outpt (Yes)   Obs ( No  )   Diagnosis: N20.1 Left Ureteropelvic Junction Stone  -CPT: [phone]  Surgery: Left Ureteroscopy with Laser Lithotripsy and Stent Exchange   Stop Anticoagulations: No  Cardiac/Medical/Pulmonary Clearance needed: no  *Orders entered into EPIC  Date: 07/11/22   *Case booked in [place]  Date: 07/11/22  *Notified pt of Surgery: Date: 07/11/22  PRE-OP UA & CX: no  *Placed into Prior Authorization Work Cobb Date: 07/11/22  Assistant/laser/rep:No

## 2022-07-11 NOTE — Progress Notes (Signed)
Surgical Physician Order Form [facility] [place]  Dr. Bernardo Heater * Scheduling expectation :  07/15/2022  *Length of Case:   *Clearance needed: no  *Anticoagulation Instructions: N/A  *Aspirin Instructions: Ok to continue Aspirin  *Post-op visit Date/Instructions:   TBD  *Diagnosis: Left UPJ Stone  *Procedure: left  Ureteroscopy w/laser lithotripsy & stent exchange ([id number])   Additional orders: N/A  -Admit type: OUTpatient  -Anesthesia: General  -VTE Prophylaxis Standing Order SCD's       Other:   -Standing Lab Orders Per Anesthesia    Lab other: None  -Standing Test orders EKG/Chest x-ray per Anesthesia       Test other:   - Medications:  Ancef 2gm IV  -Other orders:  N/A

## 2022-07-11 NOTE — H&P (View-Only) (Signed)
07/11/2022 10:04 AM   Thomas Olson February 26, 1951 [id number]  CC: Chief Complaint  Patient presents with   discuss stone management options   HPI: Thomas Olson is a 72 y.o. male with PMH recurrent nephrolithiasis and BPH with LUTS s/p UroLift in 2019 on Flomax who was seen at the [facility] ED 9 days ago with severe left renal colic and leukocytosis secondary to an 11 mm left proximal ureteral stone requiring urgent left ureteral stent placement who presents today to discuss definitive stone management.  He is accompanied today by his wife, who contributes to HPI.  Today he reports left sided stent symptoms, which are typical for him with ureteral stents.  He has been taking Toradol, oxybutynin, oxycodone, and Flomax to manage these and is about to run out of all of them.  He reports bothersome dry mouth, dry sinuses, and constipation.  He denies fever, chills, nausea, or vomiting.  They wonder about stone prevention measures and he admits to an extensive family history of nephrolithiasis and drinking a lot of Pepsi.  Per chart review, his UA at the time of ED presentation was rather bland with no nitrites, no pyuria, no hematuria, and no bacteriuria.  No urine culture results available for review.  Radiology report from his CT stone study describes an 11 mm obstructing left UPJ stone with left perinephric stranding and bilateral nonobstructing renal stones.  In-office UA today positive for 3+ blood, 2+ protein, and 1+ leukocytes; urine microscopy with 6-10 WBCs/HPF, >30 RBCs/HPF, and moderate bacteria.   PMH: Past Medical History:  Diagnosis Date   CVA (cerebral vascular accident) ([place])    08/2015, no residuals   Depression    History of kidney stones    Kidney stones    Sleep apnea    no cpap     Surgical History: Past Surgical History:  Procedure Laterality Date   CYSTOSCOPY     CYSTOSCOPY W/ RETROGRADES Right 04/10/2020   Procedure: CYSTOSCOPY WITH RETROGRADE PYELOGRAM;  Surgeon:  Abbie Sons, MD;  Location: [facility] ORS;  Service: Urology;  Laterality: Right;   CYSTOSCOPY WITH INSERTION OF UROLIFT N/A 02/12/2018   Procedure: CYSTOSCOPY WITH INSERTION OF UROLIFT;  Surgeon: Abbie Sons, MD;  Location: [facility] ORS;  Service: Urology;  Laterality: N/A;   CYSTOSCOPY WITH LITHOLAPAXY N/A 02/12/2018   Procedure: CYSTOSCOPY WITH LITHOLAPAXY;  Surgeon: Abbie Sons, MD;  Location: [facility] ORS;  Service: Urology;  Laterality: N/A;   CYSTOSCOPY WITH LITHOLAPAXY N/A 04/10/2020   Procedure: CYSTOSCOPY WITH LITHOLAPAXY;  Surgeon: Abbie Sons, MD;  Location: [facility] ORS;  Service: Urology;  Laterality: N/A;   CYSTOSCOPY/URETEROSCOPY/HOLMIUM LASER/STENT PLACEMENT Right 04/10/2020   Procedure: CYSTOSCOPY/URETEROSCOPY/HOLMIUM LASER/STENT PLACEMENT;  Surgeon: Abbie Sons, MD;  Location: [facility] ORS;  Service: Urology;  Laterality: Right;   HOLMIUM LASER APPLICATION N/A 6/75/9163   Procedure: HOLMIUM LASER APPLICATION;  Surgeon: Abbie Sons, MD;  Location: [facility] ORS;  Service: Urology;  Laterality: N/A;   LITHOTRIPSY      Home Medications:  Allergies as of 07/11/2022       Reactions   Dilaudid [hydromorphone Hcl] Anaphylaxis        Medication List        Accurate as of July 11, 2022 10:04 AM. If you have any questions, ask your nurse or doctor.          acetaminophen 500 MG tablet Commonly known as: TYLENOL Take 1,000 mg by mouth every 6 (six) hours as needed.  aspirin EC 81 MG tablet Take 81 mg by mouth daily.   atorvastatin 80 MG tablet Commonly known as: LIPITOR Take 80 mg by mouth daily.   citalopram 20 MG tablet Commonly known as: CELEXA Take 20 mg by mouth daily.   docusate sodium 100 MG capsule Commonly known as: COLACE Take 100 mg by mouth daily.   ketorolac 10 MG tablet Commonly known as: TORADOL Take 10 mg by mouth every 6 (six) hours as needed.   oxybutynin 5 MG tablet Commonly known as: DITROPAN Take 5 mg by mouth 3 (three) times  daily as needed.   oxyCODONE 5 MG immediate release tablet Commonly known as: Oxy IR/ROXICODONE Take 5 mg by mouth every 6 (six) hours as needed.   senna 8.6 MG tablet Commonly known as: SENOKOT Take 1 tablet by mouth daily.   tamsulosin 0.4 MG Caps capsule Commonly known as: FLOMAX Take 0.4 mg by mouth daily.        Allergies:  Allergies  Allergen Reactions   Dilaudid [Hydromorphone Hcl] Anaphylaxis    Family History: No family history on file.  Social History:   reports that he has never smoked. He has never used smokeless tobacco. He reports that he does not drink alcohol and does not use drugs.  Physical Exam: BP (!) 146/79   Pulse (!) 49   Ht 5\' 7"  (1.702 m)   Wt 179 lb 8 oz (81.4 kg)   BMI 28.11 kg/m   Constitutional:  Alert and oriented, no acute distress, nontoxic appearing HEENT: [place], AT Cardiovascular: No clubbing, cyanosis, or edema Respiratory: Normal respiratory effort, no increased work of breathing Skin: No rashes, bruises or suspicious lesions Neurologic: Grossly intact, no focal deficits, moving all 4 extremities Psychiatric: Normal mood and affect  Laboratory Data: Results for orders placed or performed in visit on 07/11/22  Microscopic Examination   Urine  Result Value Ref Range   WBC, UA 6-10 (A) 0 - 5 /hpf   RBC, Urine >30 (A) 0 - 2 /hpf   Epithelial Cells (non renal) 0-10 0 - 10 /hpf   Casts Present (A) None seen /lpf   Cast Type Hyaline casts N/A   Mucus, UA Present (A) Not Estab.   Bacteria, UA Moderate (A) None seen/Few  Urinalysis, Complete  Result Value Ref Range   Specific Gravity, UA 1.025 1.005 - 1.030   pH, UA 5.5 5.0 - 7.5   Color, UA Yellow Yellow   Appearance Ur Hazy (A) Clear   Leukocytes,UA 1+ (A) Negative   Protein,UA 2+ (A) Negative/Trace   Glucose, UA Negative Negative   Ketones, UA Negative Negative   RBC, UA 3+ (A) Negative   Bilirubin, UA Negative Negative   Urobilinogen, Ur 0.2 0.2 - 1.0 mg/dL   Nitrite,  UA Negative Negative   Microscopic Examination See below:    Assessment & Plan:   1. Left ureteral stone 11 mm left UPJ stone s/p left ureteral stent placement at [facility] 9 days ago.  Also with bilateral nonobstructing renal stones.  UA today with mild pyuria, microscopic hematuria, and bacteriuria.  He is not clinically infected.  We discussed various treatment options for his stones including ESWL vs. ureteroscopy with laser lithotripsy and stent.  We specifically discussed that ESWL is a less invasive procedure that requires less anesthesia, however patients have to pass their residual stone fragments, which may take several weeks and be associated with continued renal colic. Additionally, we discussed the limitations of ESWL including the ability  to only treat one stone in a single treatment. By comparison, ureteroscopy is a more invasive surgical procedure that requires more anesthesia, but we can potentially treat multiple stones in a single procedure. However, URS does require placement of a ureteral stent, which will remain in place for approximately 3-10 days and can be associated with flank pain, bladder pain, dysuria, urgency, frequency, urinary leakage, and gross hematuria.  Based on this conversation, he would like to proceed with left ureteroscopy with laser lithotripsy and stent exchange.  OR orders placed.  Will plan for next week on Dr. Stoioff schedule.  Will refill oxybutynin, but switch to XL formulation since he is having some pretty marked anticholinergic side effects on the IR formulation.  Also refilling Flomax and oxycodone and starting him on MiraLAX to manage his constipation.  Will send urine for preop culture today.  He is in agreement with this plan. - Urinalysis, Complete - CULTURE, URINE COMPREHENSIVE - oxybutynin (DITROPAN-XL) 10 MG 24 hr tablet; Take 1 tablet (10 mg total) by mouth daily.  Dispense: 30 tablet; Refill: 0 - tamsulosin (FLOMAX) 0.4 MG CAPS capsule; Take  1 capsule (0.4 mg total) by mouth daily.  Dispense: 30 capsule; Refill: 0 - oxyCODONE (OXY IR/ROXICODONE) 5 MG immediate release tablet; Take 1 tablet (5 mg total) by mouth every 6 (six) hours as needed.  Dispense: 16 tablet; Refill: 0 - polyethylene glycol powder (MIRALAX) 17 GM/SCOOP powder; Take 17 g by mouth daily.  Dispense: 255 g; Refill: 0   Return in 4 days (on 07/15/2022) for Left ureteroscopy with laser lithotripsy and stent exchange with Dr. Stoioff.  Thomas Ghosh, PA-C  [facility] [address], [address] , [place] [address] [phone] 

## 2022-07-11 NOTE — Telephone Encounter (Signed)
I spoke with Mr. Thomas Olson and his wife while in office. We have discussed possible surgery dates and Tuesday January 23rd, 2024 was agreed upon by all parties. Patient given information about surgery date, what to expect pre-operatively and post operatively.  We discussed that a Pre-Admission Testing office will be calling to set up the pre-op visit that will take place prior to surgery, and that these appointments are typically done over the phone with a Pre-Admissions RN. Informed patient that our office will communicate any additional care to be provided after surgery. Patients questions or concerns were discussed during our call. Advised to call our office should there be any additional information, questions or concerns that arise. Patient verbalized understanding.

## 2022-07-14 ENCOUNTER — Encounter
Admission: RE | Admit: 2022-07-14 | Discharge: 2022-07-14 | Disposition: A | Discharge status: Home / Self Care | Payer: Medicare Other | Source: Ambulatory Visit | Attending: Urology | Admitting: Urology

## 2022-07-14 ENCOUNTER — Other Ambulatory Visit: Payer: Self-pay

## 2022-07-14 VITALS — Ht 67.0 in | Wt 175.0 lb

## 2022-07-14 DIAGNOSIS — G4730 Sleep apnea, unspecified: Secondary | ICD-10-CM

## 2022-07-14 DIAGNOSIS — E876 Hypokalemia: Secondary | ICD-10-CM

## 2022-07-14 DIAGNOSIS — I639 Cerebral infarction, unspecified: Secondary | ICD-10-CM

## 2022-07-14 DIAGNOSIS — Z01818 Encounter for other preprocedural examination: Secondary | ICD-10-CM

## 2022-07-14 NOTE — Patient Instructions (Signed)
Your procedure is scheduled on: 07/15/22 Report to [place]. To find out your arrival time please call [phone] between 1PM - 3PM on 07/14/22.  Remember: Instructions that are not followed completely may result in serious medical risk, up to and including death, or upon the discretion of your surgeon and anesthesiologist your surgery may need to be rescheduled.     _X__ 1. Do not eat food or drink any liquids after midnight the night before your procedure.                 No gum chewing or hard candies.   __X__2.  On the morning of surgery brush your teeth with toothpaste and water, you                 may rinse your mouth with mouthwash if you wish.  Do not swallow any              toothpaste of mouthwash.     _X__ 3.  No Alcohol for 24 hours before or after surgery.   _X__ 4.  Do Not Smoke or use e-cigarettes For 24 Hours Prior to Your Surgery.                 Do not use any chewable tobacco products for at least 6 hours prior to                 surgery.  ____  5.  Bring all medications with you on the day of surgery if instructed.   __X__  6.  Notify your doctor if there is any change in your medical condition                 (cold, fever, infections).     Do not wear jewelry, make-up, hairpins, clips or nail polish. Do not wear lotions, powders, or perfumes. You may wear deodorant  Do not shave body hair 48 hours prior to surgery. Men may shave face and neck. Do not bring valuables to the hospital.                       [facility] is not responsible for any belongings or valuables.  Contacts, dentures/partials or body piercings may not be worn into surgery. Bring a case for your contacts, glasses or hearing aids, a denture cup will be supplied. Leave your suitcase in the car. After surgery it may be brought to your room. For patients admitted to the hospital, discharge time is determined by your treatment  team.   Patients discharged the day of surgery will need an adult driver. You will not be allowed to drive yourself home, take an uber, lyft or taxi. You may use medical transportation such as [place] as long as you have an adult with you or waiting to assist you upon your arrival home. You must have an adult over 90 years old in the home with you for the first 24 hours after surgery    __X__ Take these medicines the morning of surgery with A SIP OF WATER:    1. atorvastatin (LIPITOR) 80 MG tablet   2. citalopram (CELEXA) 20 MG tablet  3. oxybutynin (DITROPAN-XL) 10 MG 24 hr tablet   4. tamsulosin (FLOMAX) 0.4 MG CAPS capsule  5. oxyCODONE (OXY IR/ROXICODONE) 5 MG immediate release tablet if needed  ____ Fleet Enema (as directed)   ____ Use CHG Soap/SAGE wipes as directed  ____ Use inhalers on the day of surgery  ____ Stop metformin/Janumet/Farxiga 2 days prior to surgery    ____ Take 1/2 of usual insulin dose the night before surgery. No insulin the morning          of surgery.   ____ Stop Blood Thinners Coumadin/Plavix/Xarelto/Pleta/Pradaxa/Eliquis/Effient/Aspirin  on   Or contact your Surgeon, Cardiologist or Medical Doctor regarding  ability to stop your blood thinners  __X__ Stop Anti-inflammatories 7 days before surgery such as Advil, Ibuprofen, Motrin,  BC or Goodies Powder, Naprosyn, Naproxen, Aleve, Aspirin    __X__ Stop all herbals and supplements, fish oil or vitamins  until after surgery.    ____ Bring C-Pap to the hospital.

## 2022-07-15 ENCOUNTER — Ambulatory Visit: Payer: Medicare Other | Admitting: Anesthesiology

## 2022-07-15 ENCOUNTER — Ambulatory Visit
Admission: RE | Admit: 2022-07-15 | Discharge: 2022-07-15 | Disposition: A | Discharge status: Home / Self Care | Payer: Medicare Other | Attending: Urology | Admitting: Urology

## 2022-07-15 ENCOUNTER — Encounter: Payer: Self-pay | Admitting: Urology

## 2022-07-15 ENCOUNTER — Other Ambulatory Visit: Payer: Self-pay

## 2022-07-15 ENCOUNTER — Ambulatory Visit: Payer: Medicare Other

## 2022-07-15 ENCOUNTER — Other Ambulatory Visit: Payer: Self-pay | Admitting: Urology

## 2022-07-15 ENCOUNTER — Encounter
Admission: RE | Disposition: A | Discharge status: Home / Self Care | Payer: Self-pay | Source: Home / Self Care | Attending: Urology

## 2022-07-15 DIAGNOSIS — Z8673 Personal history of transient ischemic attack (TIA), and cerebral infarction without residual deficits: Secondary | ICD-10-CM | POA: Insufficient documentation

## 2022-07-15 DIAGNOSIS — N201 Calculus of ureter: Secondary | ICD-10-CM

## 2022-07-15 DIAGNOSIS — I639 Cerebral infarction, unspecified: Secondary | ICD-10-CM

## 2022-07-15 DIAGNOSIS — N200 Calculus of kidney: Secondary | ICD-10-CM | POA: Diagnosis not present

## 2022-07-15 DIAGNOSIS — G4730 Sleep apnea, unspecified: Secondary | ICD-10-CM

## 2022-07-15 DIAGNOSIS — N202 Calculus of kidney with calculus of ureter: Secondary | ICD-10-CM | POA: Diagnosis not present

## 2022-07-15 DIAGNOSIS — K5900 Constipation, unspecified: Secondary | ICD-10-CM | POA: Insufficient documentation

## 2022-07-15 DIAGNOSIS — N401 Enlarged prostate with lower urinary tract symptoms: Secondary | ICD-10-CM | POA: Insufficient documentation

## 2022-07-15 DIAGNOSIS — R001 Bradycardia, unspecified: Secondary | ICD-10-CM | POA: Diagnosis not present

## 2022-07-15 DIAGNOSIS — E876 Hypokalemia: Secondary | ICD-10-CM

## 2022-07-15 HISTORY — PX: CYSTOSCOPY/URETEROSCOPY/HOLMIUM LASER/STENT PLACEMENT: SHX6546

## 2022-07-15 LAB — POCT I-STAT, CHEM 8
BUN: 16 mg/dL (ref 8–23)
Calcium, Ion: 1.17 mmol/L (ref 1.15–1.40)
Chloride: 104 mmol/L (ref 98–111)
Creatinine, Ser: 1 mg/dL (ref 0.61–1.24)
Glucose, Bld: 104 mg/dL — ABNORMAL HIGH (ref 70–99)
HCT: 40 % (ref 39.0–52.0)
Hemoglobin: 13.6 g/dL (ref 13.0–17.0)
Potassium: 4 mmol/L (ref 3.5–5.1)
Sodium: 141 mmol/L (ref 135–145)
TCO2: 25 mmol/L (ref 22–32)

## 2022-07-15 SURGERY — CYSTOSCOPY/URETEROSCOPY/HOLMIUM LASER/STENT PLACEMENT
Anesthesia: General | Laterality: Left

## 2022-07-15 MED ORDER — PROPOFOL 10 MG/ML IV BOLUS
INTRAVENOUS | Status: DC | PRN
  Administered 2022-07-15: 30 mg via INTRAVENOUS
  Administered 2022-07-15: 20 mg via INTRAVENOUS
  Administered 2022-07-15: 150 mg via INTRAVENOUS

## 2022-07-15 MED ORDER — CEFAZOLIN SODIUM-DEXTROSE 2-4 GM/100ML-% IV SOLN
2.0000 g | INTRAVENOUS | Status: AC
  Administered 2022-07-15: 2 g via INTRAVENOUS

## 2022-07-15 MED ORDER — CEFAZOLIN SODIUM-DEXTROSE 2-4 GM/100ML-% IV SOLN
INTRAVENOUS | Status: AC
  Filled 2022-07-15: qty 100

## 2022-07-15 MED ORDER — LIDOCAINE HCL (CARDIAC) PF 100 MG/5ML IV SOSY
PREFILLED_SYRINGE | INTRAVENOUS | Status: DC | PRN
  Administered 2022-07-15: 60 mg via INTRAVENOUS

## 2022-07-15 MED ORDER — IOHEXOL 180 MG/ML  SOLN
INTRAMUSCULAR | Status: DC | PRN
  Administered 2022-07-15: 10 mL
  Administered 2022-07-15: 20 mL

## 2022-07-15 MED ORDER — DEXAMETHASONE SODIUM PHOSPHATE 10 MG/ML IJ SOLN
INTRAMUSCULAR | Status: DC | PRN
  Administered 2022-07-15: 5 mg via INTRAVENOUS

## 2022-07-15 MED ORDER — OXYBUTYNIN CHLORIDE ER 10 MG PO TB24: 10.0000 mg | ORAL_TABLET | Freq: Every day | ORAL | 0 refills | Status: DC

## 2022-07-15 MED ORDER — FENTANYL CITRATE (PF) 100 MCG/2ML IJ SOLN
INTRAMUSCULAR | Status: AC
  Filled 2022-07-15: qty 2

## 2022-07-15 MED ORDER — CHLORHEXIDINE GLUCONATE 0.12 % MT SOLN: 15.0000 mL | Freq: Once | OROMUCOSAL | Status: AC

## 2022-07-15 MED ORDER — SODIUM CHLORIDE 0.9 % IR SOLN
Status: DC | PRN
  Administered 2022-07-15: 3000 mL via INTRAVESICAL

## 2022-07-15 MED ORDER — FENTANYL CITRATE (PF) 100 MCG/2ML IJ SOLN
INTRAMUSCULAR | Status: DC | PRN
  Administered 2022-07-15 (×2): 25 ug via INTRAVENOUS
  Administered 2022-07-15: 50 ug via INTRAVENOUS

## 2022-07-15 MED ORDER — FAMOTIDINE 20 MG PO TABS
ORAL_TABLET | ORAL | Status: AC
  Administered 2022-07-15: 20 mg via ORAL
  Filled 2022-07-15: qty 1

## 2022-07-15 MED ORDER — LACTATED RINGERS IV SOLN: INTRAVENOUS | Status: DC | PRN

## 2022-07-15 MED ORDER — CHLORHEXIDINE GLUCONATE 0.12 % MT SOLN
OROMUCOSAL | Status: AC
  Administered 2022-07-15: 15 mL via OROMUCOSAL
  Filled 2022-07-15: qty 15

## 2022-07-15 MED ORDER — PROPOFOL 10 MG/ML IV BOLUS
INTRAVENOUS | Status: AC
  Filled 2022-07-15: qty 20

## 2022-07-15 MED ORDER — ONDANSETRON HCL 4 MG/2ML IJ SOLN
INTRAMUSCULAR | Status: DC | PRN
  Administered 2022-07-15: 4 mg via INTRAVENOUS

## 2022-07-15 MED ORDER — EPHEDRINE SULFATE (PRESSORS) 50 MG/ML IJ SOLN
INTRAMUSCULAR | Status: DC | PRN
  Administered 2022-07-15 (×2): 5 mg via INTRAVENOUS

## 2022-07-15 MED ORDER — LACTATED RINGERS IV SOLN: INTRAVENOUS | Status: DC

## 2022-07-15 MED ORDER — GLYCOPYRROLATE 0.2 MG/ML IJ SOLN
INTRAMUSCULAR | Status: DC | PRN
  Administered 2022-07-15: .2 mg via INTRAVENOUS

## 2022-07-15 MED ORDER — ORAL CARE MOUTH RINSE: 15.0000 mL | Freq: Once | OROMUCOSAL | Status: AC

## 2022-07-15 MED ORDER — FAMOTIDINE 20 MG PO TABS: 20.0000 mg | ORAL_TABLET | Freq: Once | ORAL | Status: AC

## 2022-07-15 SURGICAL SUPPLY — 32 items
ADH LQ OCL WTPRF AMP STRL LF (MISCELLANEOUS)
ADHESIVE MASTISOL STRL (MISCELLANEOUS) IMPLANT
BAG DRAIN SIEMENS DORNER NS (MISCELLANEOUS) ×1 IMPLANT
BAG DRN NS LF (MISCELLANEOUS) ×1
BASKET LASER NITINOL 1.9FR (BASKET) IMPLANT
BASKET ZERO TIP 1.9FR (BASKET) IMPLANT
BRUSH SCRUB EZ 1% IODOPHOR (MISCELLANEOUS) ×1 IMPLANT
BSKT STON RTRVL 120 1.9FR (BASKET) ×1
BSKT STON RTRVL ZERO TP 1.9FR (BASKET)
CATH URET FLEX-TIP 2 LUMEN 10F (CATHETERS) IMPLANT
CATH URETL OPEN END 6X70 (CATHETERS) IMPLANT
CNTNR URN SCR LID CUP LEK RST (MISCELLANEOUS) IMPLANT
CONT SPEC 4OZ STRL OR WHT (MISCELLANEOUS)
DRAPE UTILITY 15X26 TOWEL STRL (DRAPES) ×1 IMPLANT
DRSG TEGADERM 2-3/8X2-3/4 SM (GAUZE/BANDAGES/DRESSINGS) IMPLANT
FIBER LASER MOSES 200 DFL (Laser) IMPLANT
GLOVE SURG UNDER POLY LF SZ7.5 (GLOVE) ×1 IMPLANT
GOWN STRL REUS W/ TWL LRG LVL3 (GOWN DISPOSABLE) ×1 IMPLANT
GOWN STRL REUS W/ TWL XL LVL3 (GOWN DISPOSABLE) ×1 IMPLANT
GOWN STRL REUS W/TWL LRG LVL3 (GOWN DISPOSABLE) ×1
GOWN STRL REUS W/TWL XL LVL3 (GOWN DISPOSABLE) ×1
GUIDEWIRE STR DUAL SENSOR (WIRE) ×1 IMPLANT
IV NS IRRIG 3000ML ARTHROMATIC (IV SOLUTION) ×1 IMPLANT
KIT TURNOVER CYSTO (KITS) ×1 IMPLANT
PACK CYSTO AR (MISCELLANEOUS) ×1 IMPLANT
SET CYSTO W/LG BORE CLAMP LF (SET/KITS/TRAYS/PACK) ×1 IMPLANT
SHEATH NAVIGATOR HD 12/14X36 (SHEATH) IMPLANT
STENT URET 6FRX24 CONTOUR (STENTS) IMPLANT
STENT URET 6FRX26 CONTOUR (STENTS) IMPLANT
SURGILUBE 2OZ TUBE FLIPTOP (MISCELLANEOUS) ×1 IMPLANT
VALVE UROSEAL ADJ ENDO (VALVE) IMPLANT
WATER STERILE IRR 500ML POUR (IV SOLUTION) ×1 IMPLANT

## 2022-07-15 NOTE — Discharge Instructions (Addendum)
DISCHARGE INSTRUCTIONS FOR KIDNEY STONE/URETERAL STENT   MEDICATIONS:  1. Resume all your other meds from home.  2.  AZO (over-the-counter) can help with the burning/stinging when you urinate.   ACTIVITY:  1. May resume regular activities in 24 hours. 2. No driving while on narcotic pain medications  3. Drink plenty of water  4. Continue to walk at home - you can still get blood clots when you are at home, so keep active, but don't over do it.  5. May return to work/school tomorrow or when you feel ready    SIGNS/SYMPTOMS TO CALL:  Common postoperative symptoms include urinary frequency, urgency, bladder spasm and blood in the urine  Please call us if you have a fever greater than 101.5, uncontrolled nausea/vomiting, uncontrolled pain, dizziness, unable to urinate, excessively bloody urine, chest pain, shortness of breath, leg swelling, leg pain, or any other concerns or questions.   You can reach [place] at [phone].   FOLLOW-UP:  1. You will be contacted by our office for an appointment for stent removal    AMBULATORY SURGERY  DISCHARGE INSTRUCTIONS   The drugs that you were given will stay in your system until tomorrow so for the next 24 hours you should not:  Drive an automobile Make any legal decisions Drink any alcoholic beverage   You may resume regular meals tomorrow.  Today it is better to start with liquids and gradually work up to solid foods.  You may eat anything you prefer, but it is better to start with liquids, then soup and crackers, and gradually work up to solid foods.   Please notify your doctor immediately if you have any unusual bleeding, trouble breathing, redness and pain at the surgery site, drainage, fever, or pain not relieved by medication.    Additional Instructions:   Please contact your physician with any problems or Same Day Surgery at [phone], Monday through Friday 6 am to 4 pm, or [place] at [facility] [place] number at  [phone].

## 2022-07-15 NOTE — Op Note (Signed)
Preoperative diagnosis: Left nephrolithiasis   Postoperative diagnosis: Left nephrolithiasis   Procedure:  Cystoscopy Left ureteroscopy and stone removal Ureteroscopic laser lithotripsy Left ureteral stent exchange (75F/24 cm)  Left retrograde pyelography with interpretation  Surgeon: Nicki Reaper C. Glyndon Tursi, M.D.  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  Prostate with moderate lateral lobe enlargement and bladder neck elevation Bladder mucosa without solid or papillary lesions.  2 small calculi noted in the bladder.  Mild inflammatory changes left hemitrigone secondary to indwelling stent Left ureteroscopy-distal, mid and proximal ureter normal in appearance.  Slight narrowing of the UPJ which did not impede passage of the flexible cystoscope.  Pyeloscopy with multiple Randall plaques.  Minute lower calyceal calculus at the renal papilla.  10 mm calculus identified in a lower pole calyx. Left retrograde pyelography post procedure showed no filling defects, stone fragments or contrast extravasation  EBL: Minimal  Specimens: None   Indication: Thomas Olson is a 72 y.o. male with a history of recurrent stone disease.  He presented to the [facility] ED 07/02/2022 with severe renal colic secondary to a 10 mm UPJ calculus.  He had intractable pain and was managed with urgent stent placement and discharged the day of the procedure.  After reviewing the management options for treatment, the patient elected to proceed with the above surgical procedure(s). We have discussed the potential benefits and risks of the procedure, side effects of the proposed treatment, the likelihood of the patient achieving the goals of the procedure, and any potential problems that might occur during the procedure or recuperation. Informed consent  has been obtained.  Description of procedure:  The patient was taken to the operating room and general anesthesia was induced.  The patient was placed in the dorsal lithotomy position, prepped and draped in the usual sterile fashion, and preoperative antibiotics were administered. A preoperative time-out was performed.   A 21 French cystoscope was lubricated, passed per urethra and advanced proximally under direct vision with findings as described above.   The stent was grasped with endoscopic forceps and brought out to the urethral meatus.  A 0.038 Sensor wire was then placed at the stent and advanced into the renal pelvis under fluoroscopic guidance without difficulty followed by stent removal.  A dual-lumen ureteral catheter was then placed over the guidewire and retrograde pyelogram was performed which showed the calculus in the lower pole calyx.  A second guidewire was placed and the dual-lumen catheter was removed.  A single channel digital flexible ureteroscope was placed through over the guidewire and advanced into the ureter without problems.  Once in the lower proximal ureter the guidewire was removed and the ureteroscope was advanced without difficulty into the renal pelvis with findings as described above.  The 10 mm calculus was placed and an Escape basket and relocated to a midpole calyx.  The calculus was then dusted with a 200 m Moses holmium laser fiber at a setting of 0.3J/80 Hz which was decreased to 40 Hz once the calculus was reduced in size.  Once completely dusted noncontact laser lithotripsy was performed at a setting of 0.6J/40 Hz.  No fragments larger than the tip of the laser fiber were identified.  Contrast was instilled through the ureteroscope with findings as described above.  All calyces were then examined under fluoroscopic guidance and no fragments were identified.  The small papillary calculus was  treated with the holmium laser.  The ureteroscope was withdrawn  under direct vision.  A 25F/24 cm Contour ureteral stent was then placed on fluoroscopic guidance with good positioning noted proximally and distally.  The bladder was then emptied and the procedure ended.  The patient appeared to tolerate the procedure well and without complications.  After anesthetic reversal the patient was transported to the PACU in stable condition.   Plan: He will be scheduled for office stent removal in approximately 1 week  John Giovanni, MD

## 2022-07-15 NOTE — Interval H&P Note (Signed)
History and Physical Interval Note:  CV:RRR Lungs:clear  07/15/2022 1:58 PM  Diar L Struble  has presented today for surgery, with the diagnosis of Left Ureteropelvic Junction Stone.  The various methods of treatment have been discussed with the patient and family. After consideration of risks, benefits and other options for treatment, the patient has consented to  Procedure(s): CYSTOSCOPY/URETEROSCOPY/HOLMIUM LASER/STENT EXCHANGE (Left) as a surgical intervention.  The patient's history has been reviewed, patient examined, no change in status, stable for surgery.  I have reviewed the patient's chart and labs.  Questions were answered to the patient's satisfaction.     [place]

## 2022-07-15 NOTE — Anesthesia Preprocedure Evaluation (Signed)
Anesthesia Evaluation  Patient identified by MRN, date of birth, ID band Patient awake    Reviewed: Allergy & Precautions, H&P , NPO status , Patient's Chart, lab work & pertinent test results, reviewed documented beta blocker date and time   Airway Mallampati: II  TM Distance: >3 FB Neck ROM: full    Dental  (+) Teeth Intact   Pulmonary neg pulmonary ROS   Pulmonary exam normal        Cardiovascular Exercise Tolerance: Poor negative cardio ROS Normal cardiovascular exam Rhythm:regular Rate:Normal     Neuro/Psych  PSYCHIATRIC DISORDERS  Depression    CVA, No Residual Symptoms    GI/Hepatic negative GI ROS, Neg liver ROS,,,  Endo/Other  negative endocrine ROS    Renal/GU Renal disease  negative genitourinary   Musculoskeletal   Abdominal   Peds  Hematology negative hematology ROS (+)   Anesthesia Other Findings Past Medical History: No date: CVA (cerebral vascular accident) ([place])     Comment:  08/2015, no residuals No date: Depression No date: History of kidney stones No date: Kidney stones No date: Sleep apnea     Comment:  no cpap  Past Surgical History: No date: CYSTOSCOPY 04/10/2020: CYSTOSCOPY W/ RETROGRADES; Right     Comment:  Procedure: CYSTOSCOPY WITH RETROGRADE PYELOGRAM;                Surgeon: Abbie Sons, MD;  Location: [facility] ORS;                Service: Urology;  Laterality: Right; 02/12/2018: CYSTOSCOPY WITH INSERTION OF UROLIFT; N/A     Comment:  Procedure: CYSTOSCOPY WITH INSERTION OF UROLIFT;                Surgeon: Abbie Sons, MD;  Location: [facility] OR[facility];                Service: Urology;  Laterality: N/A; 02/12/2018: CYSTOSCOPY WITH LITHOLAPAXY; N/A     Comment:  Procedure: CYSTOSCOPY WITH LITHOLAPAXY;  Surgeon:               Abbie Sons, MD;  Location: [facility] OR[facility];  Service:               Urology;  Laterality: N/A; 04/10/2020: CYSTOSCOPY WITH LITHOLAPAXY; N/A     Comment:   Procedure: CYSTOSCOPY WITH LITHOLAPAXY;  Surgeon:               Abbie Sons, MD;  Location: [facility] OR[facility];  Service:               Urology;  Laterality: N/A; 04/10/2020: CYSTOSCOPY/URETEROSCOPY/HOLMIUM LASER/STENT PLACEMENT;  Right     Comment:  Procedure: CYSTOSCOPY/URETEROSCOPY/HOLMIUM LASER/STENT               PLACEMENT;  Surgeon: Abbie Sons, MD;  Location:               [facility] ORS;  Service: Urology;  Laterality: Right; 02/12/2018: HOLMIUM LASER APPLICATION; N/A     Comment:  Procedure: HOLMIUM LASER APPLICATION;  Surgeon: Abbie Sons, MD;  Location: [facility] OR[facility];  Service: Urology;                Laterality: N/A; No date: LITHOTRIPSY BMI    Body Mass Index: 27.41 kg/m     Reproductive/Obstetrics negative OB ROS  Anesthesia Physical Anesthesia Plan  ASA: 3  Anesthesia Plan: General ETT   Post-op Pain Management:    Induction:   PONV Risk Score and Plan:   Airway Management Planned:   Additional Equipment:   Intra-op Plan:   Post-operative Plan:   Informed Consent: I have reviewed the patients History and Physical, chart, labs and discussed the procedure including the risks, benefits and alternatives for the proposed anesthesia with the patient or authorized representative who has indicated his/her understanding and acceptance.     Dental Advisory Given  Plan Discussed with: CRNA  Anesthesia Plan Comments:        Anesthesia Quick Evaluation

## 2022-07-15 NOTE — Transfer of Care (Signed)
Immediate Anesthesia Transfer of Care Note  Patient: Thomas Olson  Procedure(s) Performed: CYSTOSCOPY/URETEROSCOPY/HOLMIUM LASER/STENT EXCHANGE (Left)  Patient Location: PACU  Anesthesia Type:General  Level of Consciousness: awake and drowsy  Airway & Oxygen Therapy: Patient Spontanous Breathing and Patient connected to face mask oxygen  Post-op Assessment: Report given to RN, Post -op Vital signs reviewed and stable, and Patient moving all extremities  Post vital signs: Reviewed  Last Vitals:  Vitals Value Taken Time  BP 121/66 07/15/22 1530  Temp 62F   Pulse 71 07/15/22 1532  Resp 14 07/15/22 1532  SpO2 98 % 07/15/22 1532  Vitals shown include unvalidated device data.  Last Pain:  Vitals:   07/15/22 1219  TempSrc: Temporal  PainSc: 6          Complications: No notable events documented.

## 2022-07-15 NOTE — Anesthesia Postprocedure Evaluation (Signed)
Anesthesia Post Note  Patient: Thomas Olson  Procedure(s) Performed: CYSTOSCOPY/URETEROSCOPY/HOLMIUM LASER/STENT EXCHANGE (Left)  Patient location during evaluation: PACU Anesthesia Type: General Level of consciousness: awake and alert Pain management: pain level controlled Vital Signs Assessment: post-procedure vital signs reviewed and stable Respiratory status: spontaneous breathing, nonlabored ventilation, respiratory function stable and patient connected to nasal cannula oxygen Cardiovascular status: blood pressure returned to baseline and stable Postop Assessment: no apparent nausea or vomiting Anesthetic complications: no  No notable events documented.   Last Vitals:  Vitals:   07/15/22 1600 07/15/22 1625  BP: (!) 140/79 (!) 140/77  Pulse: 69 64  Resp: 15 16  Temp:  (!) 36.1 C  SpO2: 98% 99%    Last Pain:  Vitals:   07/15/22 1625  TempSrc: Temporal  PainSc: [place]

## 2022-07-16 ENCOUNTER — Encounter: Payer: Self-pay | Admitting: Urology

## 2022-07-16 LAB — CULTURE, URINE COMPREHENSIVE

## 2022-07-18 ENCOUNTER — Telehealth: Payer: Self-pay | Admitting: *Deleted

## 2022-07-18 MED ORDER — CIPROFLOXACIN HCL 250 MG PO TABS: 250.0000 mg | ORAL_TABLET | Freq: Two times a day (BID) | ORAL | 0 refills | Status: AC

## 2022-07-18 NOTE — Telephone Encounter (Signed)
-----  Message from [place], [place] sent at 07/18/2022 11:36 AM EST ----- Let's do Cipro 250mg  BID x3 days starting 3 days prior to surgery.

## 2022-07-18 NOTE — Telephone Encounter (Signed)
Spoke with patient's wife and advised results  Pt has already had surgery but he is still wanting this prescription. Sent to [facility]

## 2022-07-28 ENCOUNTER — Ambulatory Visit (INDEPENDENT_AMBULATORY_CARE_PROVIDER_SITE_OTHER): Payer: Medicare Other | Admitting: Urology

## 2022-07-28 ENCOUNTER — Encounter: Payer: Self-pay | Admitting: Urology

## 2022-07-28 VITALS — BP 101/64 | HR 80 | Ht 67.0 in | Wt 170.0 lb

## 2022-07-28 DIAGNOSIS — Z466 Encounter for fitting and adjustment of urinary device: Secondary | ICD-10-CM

## 2022-07-28 LAB — URINALYSIS, COMPLETE
Bilirubin, UA: NEGATIVE
Glucose, UA: NEGATIVE
Ketones, UA: NEGATIVE
Nitrite, UA: POSITIVE — AB
Specific Gravity, UA: 1.025 (ref 1.005–1.030)
Urobilinogen, Ur: 0.2 mg/dL (ref 0.2–1.0)
pH, UA: 5 (ref 5.0–7.5)

## 2022-07-28 LAB — MICROSCOPIC EXAMINATION
RBC, Urine: >30 /hpf — AB (ref 0–2)
WBC, UA: >30 /hpf — AB (ref 0–5)

## 2022-07-28 MED ORDER — SULFAMETHOXAZOLE-TRIMETHOPRIM 800-160 MG PO TABS: 1.0000 | ORAL_TABLET | Freq: Two times a day (BID) | ORAL | 0 refills | Status: AC

## 2022-07-28 MED ORDER — CIPROFLOXACIN HCL 500 MG PO TABS
500.0000 mg | ORAL_TABLET | Freq: Once | ORAL | Status: AC
  Administered 2022-07-28: 500 mg via ORAL

## 2022-07-28 NOTE — Progress Notes (Signed)
   Indications: Patient is 72 y.o., who is s/p ureteroscopic removal of a 10 mm left renal calculus which had migrated from the proximal ureter on 07/15/2022.  He had no postop problems.  The patient is presenting today for stent removal.  He had no postoperative complaints  Procedure:  Flexible Cystoscopy with stent removal (50277)  Timeout was performed and the correct patient, procedure and participants were identified.    Description:  The patient was prepped and draped in the usual sterile fashion. Flexible cystosopy was performed.  The stent was visualized, grasped, and removed intact without difficulty. The patient tolerated the procedure well.  A single dose of oral antibiotics was given.  Complications:  None  Plan:  UA today with microhematuria/pyuria and was nitrite positive most likely representing colonization Urine culture was ordered and we will send in Rx Septra DS twice daily x 5 days Instructed to call for fever or flank pain post stent removal Follow-up office visit 3 months with KUB   John Giovanni, MD

## 2022-07-31 LAB — CULTURE, URINE COMPREHENSIVE

## 2022-08-01 ENCOUNTER — Encounter: Payer: Self-pay | Admitting: *Deleted

## 2022-08-10 ENCOUNTER — Other Ambulatory Visit: Payer: Self-pay | Admitting: Physician Assistant

## 2022-08-10 DIAGNOSIS — N201 Calculus of ureter: Secondary | ICD-10-CM

## 2022-08-13 ENCOUNTER — Ambulatory Visit: Payer: Medicare Other | Admitting: Physician Assistant

## 2022-08-13 VITALS — BP 114/72 | HR 60 | Ht 67.0 in | Wt 168.0 lb

## 2022-08-13 DIAGNOSIS — R350 Frequency of micturition: Secondary | ICD-10-CM | POA: Diagnosis not present

## 2022-08-13 LAB — URINALYSIS, COMPLETE
Bilirubin, UA: NEGATIVE
Glucose, UA: NEGATIVE
Ketones, UA: NEGATIVE
Leukocytes,UA: NEGATIVE
Nitrite, UA: NEGATIVE
Protein,UA: NEGATIVE
RBC, UA: NEGATIVE
Specific Gravity, UA: 1.02 (ref 1.005–1.030)
Urobilinogen, Ur: 0.2 mg/dL (ref 0.2–1.0)
pH, UA: 7 (ref 5.0–7.5)

## 2022-08-13 LAB — MICROSCOPIC EXAMINATION

## 2022-08-13 LAB — BLADDER SCAN AMB NON-IMAGING: SCA Result: 24

## 2022-08-13 MED ORDER — TAMSULOSIN HCL 0.4 MG PO CAPS: 0.4000 mg | ORAL_CAPSULE | Freq: Every day | ORAL | 11 refills | Status: DC

## 2022-08-13 MED ORDER — GEMTESA 75 MG PO TABS: 75.0000 mg | ORAL_TABLET | Freq: Every day | ORAL | 0 refills | Status: DC

## 2022-08-13 NOTE — Progress Notes (Signed)
08/13/2022 1:16 PM   Mcdonald Shankel Nygard September 16, 1950 [id number]  CC: Chief Complaint  Patient presents with   Follow-up   Urinary Frequency   HPI: Thomas Olson is a 72 y.o. male with PMH recurrent nephrolithiasis and BPH with LUTS s/p UroLift in 2019 on Flomax who recently underwent left ureteroscopy with Dr. Bernardo Heater for management of a 10 mm left renal stone who presents today for evaluation of frequent urination.   We saw him in clinic most recently 16 days ago for cystoscopy stent removal, which was uncomplicated.  His UA at that time was consistent with possible colonization but he was treated with Bactrim DS twice daily x 5 days regardless.  Today he reports he immediately developed dysuria, frequency, and urgency following stent removal.  His dysuria has since resolved, however he continues to have bothersome frequency and urgency.  He is no longer taking Flomax.  He denies fever, chills, nausea, vomiting, gross hematuria, and flank pain.  In-office UA and microscopy today pan negative. PVR 71m.  PMH: Past Medical History:  Diagnosis Date   [place]    08/2015, no residuals   Depression    History of kidney stones    Kidney stones    Sleep apnea    no cpap     Surgical History: Past Surgical History:  Procedure Laterality Date   CYSTOSCOPY     CYSTOSCOPY W/ RETROGRADES Right 04/10/2020   Procedure: CYSTOSCOPY WITH RETROGRADE PYELOGRAM;  Surgeon: SAbbie Sons MD;  Location: [facility];  Service: Urology;  Laterality: Right;   CYSTOSCOPY WITH INSERTION OF UROLIFT N/A 02/12/2018   Procedure: CYSTOSCOPY WITH INSERTION OF UROLIFT;  Surgeon: SAbbie Sons MD;  Location: [facility];  Service: Urology;  Laterality: N/A;   CYSTOSCOPY WITH LITHOLAPAXY N/A 02/12/2018   Procedure: CYSTOSCOPY WITH LITHOLAPAXY;  Surgeon: SAbbie Sons MD;  Location: [facility];  Service: Urology;  Laterality: N/A;   CYSTOSCOPY WITH LITHOLAPAXY N/A 04/10/2020    Procedure: CYSTOSCOPY WITH LITHOLAPAXY;  Surgeon: SAbbie Sons MD;  Location: [facility];  Service: Urology;  Laterality: N/A;   CYSTOSCOPY/URETEROSCOPY/HOLMIUM LASER/STENT PLACEMENT Right 04/10/2020   Procedure: CYSTOSCOPY/URETEROSCOPY/HOLMIUM LASER/STENT PLACEMENT;  Surgeon: SAbbie Sons MD;  Location: [facility];  Service: Urology;  Laterality: Right;   CYSTOSCOPY/URETEROSCOPY/HOLMIUM LASER/STENT PLACEMENT Left 07/15/2022   Procedure: CYSTOSCOPY/URETEROSCOPY/HOLMIUM LASER/STENT EXCHANGE;  Surgeon: SAbbie Sons MD;  Location: [facility];  Service: Urology;  Laterality: Left;   HOLMIUM LASER APPLICATION N/A 80000000  Procedure: HOLMIUM LASER APPLICATION;  Surgeon: SAbbie Sons MD;  Location: [facility];  Service: Urology;  Laterality: N/A;   LITHOTRIPSY      Home Medications:  Allergies as of 08/13/2022       Reactions   Dilaudid [hydromorphone Hcl] Anaphylaxis        Medication List        Accurate as of August 13, 2022  1:16 PM. If you have any questions, ask your nurse or doctor.          STOP taking these medications    acetaminophen 500 MG tablet Commonly known as: TYLENOL   docusate sodium 100 MG capsule Commonly known as: COLACE   oxybutynin 10 MG 24 hr tablet Commonly known as: DITROPAN-XL   oxyCODONE 5 MG immediate release tablet Commonly known as: Oxy IR/ROXICODONE   polyethylene glycol powder 17 GM/SCOOP powder Commonly known as: MiraLax       TAKE these medications    aspirin EC 81  MG tablet Take 81 mg by mouth daily.   atorvastatin 80 MG tablet Commonly known as: LIPITOR Take 80 mg by mouth daily.   citalopram 20 MG tablet Commonly known as: CELEXA Take 20 mg by mouth daily.        Allergies:  Allergies  Allergen Reactions   Dilaudid [Hydromorphone Hcl] Anaphylaxis    Family History: No family history on file.  Social History:   reports that he has never smoked. He has never used smokeless tobacco. He reports that  he does not drink alcohol and does not use drugs.  Physical Exam: BP 114/72   Pulse 60   Ht 5' 7"$  (1.702 m)   Wt 168 lb (76.2 kg)   BMI 26.31 kg/m   Constitutional:  Alert and oriented, no acute distress, nontoxic appearing HEENT: [place], AT Cardiovascular: No clubbing, cyanosis, or edema Respiratory: Normal respiratory effort, no increased work of breathing Skin: No rashes, bruises or suspicious lesions Neurologic: Grossly intact, no focal deficits, moving all 4 extremities Psychiatric: Normal mood and affect  Laboratory Data: Results for orders placed or performed in visit on 08/13/22  Microscopic Examination   Urine  Result Value Ref Range   WBC, UA 0-5 0 - 5 /hpf   RBC, Urine 0-2 0 - 2 /hpf   Epithelial Cells (non renal) 0-10 0 - 10 /hpf   Bacteria, UA Few None seen/Few  Urinalysis, Complete  Result Value Ref Range   Specific Gravity, UA 1.020 1.005 - 1.030   pH, UA 7.0 5.0 - 7.5   Color, UA Yellow Yellow   Appearance Ur Clear Clear   Leukocytes,UA Negative Negative   Protein,UA Negative Negative/Trace   Glucose, UA Negative Negative   Ketones, UA Negative Negative   RBC, UA Negative Negative   Bilirubin, UA Negative Negative   Urobilinogen, Ur 0.2 0.2 - 1.0 mg/dL   Nitrite, UA Negative Negative   Microscopic Examination See below:   BLADDER SCAN AMB NON-IMAGING  Result Value Ref Range   SCA Result 24 ml    Assessment & Plan:   1. Urinary frequency UA today is bland and he is emptying appropriately.  New urgency/frequency, likely reactive in the setting of recent possible UTI and instrumentation.  Will resume daily Flomax and treat symptoms with 6 weeks of Gemtesa.  If his symptoms have resolved at that point, we will stop Gemtesa.  We discussed that his symptoms could also represent a small retained stone fragment near the UVJ, however in the absence of hematuria or flank pain I think this is far less likely.  May consider imaging in the future if his symptoms  persist. - Urinalysis, Complete - BLADDER SCAN AMB NON-IMAGING - Vibegron (GEMTESA) 75 MG TABS; Take 1 tablet (75 mg total) by mouth daily.  Dispense: 28 tablet; Refill: 0 - tamsulosin (FLOMAX) 0.4 MG CAPS capsule; Take 1 capsule (0.4 mg total) by mouth daily.  Dispense: 30 capsule; Refill: 11   Return in about 6 weeks (around 09/24/2022) for Symptom recheck with PVR.  Debroah Loop, PA-C  [facility] [address], [place], [place] [address] [phone]

## 2022-09-24 ENCOUNTER — Encounter: Payer: Self-pay | Admitting: Physician Assistant

## 2022-09-24 ENCOUNTER — Ambulatory Visit: Payer: Medicare Other | Admitting: Physician Assistant

## 2022-09-24 VITALS — BP 128/74 | HR 80 | Ht 68.0 in | Wt 168.0 lb

## 2022-09-24 DIAGNOSIS — Z87442 Personal history of urinary calculi: Secondary | ICD-10-CM | POA: Diagnosis not present

## 2022-09-24 DIAGNOSIS — R350 Frequency of micturition: Secondary | ICD-10-CM

## 2022-09-24 LAB — BLADDER SCAN AMB NON-IMAGING: Scan Result: 32

## 2022-09-24 NOTE — Progress Notes (Signed)
09/24/2022 1:44 PM   Thomas Olson 1951-03-26 [id number]  CC: Chief Complaint  Patient presents with   Urinary Frequency   HPI: Thomas Olson is a 72 y.o. male with PMH recurrent nephrolithiasis and BPH with LUTS s/p UroLift in 2019 on Flomax who recently underwent left URS with Dr. Bernardo Heater and developed urinary frequency/urgency following stent removal who presents today for symptom recheck on Gemtesa and Flomax.  He is accompanied today by his wife, who contributes to HPI.  Today he reports his urgency and frequency have significantly improved on Gemtesa and Flomax, however he has had some retrograde ejaculation.  He continues to have some hesitancy and frequency, but his previously reported dysuria has resolved.  He denies gross hematuria and has not seen any additional stone fragments pass.  He reports nocturia x 1-2.  They are also requesting oxalate resources for stone prevention.  He has been cutting back on sodas and increasing his p.o. fluid intake.  PVR 92mL.  PMH: Past Medical History:  Diagnosis Date   CVA (cerebral vascular accident)    08/2015, no residuals   Depression    History of kidney stones    Kidney stones    Sleep apnea    no cpap     Surgical History: Past Surgical History:  Procedure Laterality Date   CYSTOSCOPY     CYSTOSCOPY W/ RETROGRADES Right 04/10/2020   Procedure: CYSTOSCOPY WITH RETROGRADE PYELOGRAM;  Surgeon: Abbie Sons, MD;  Location: [facility];  Service: Urology;  Laterality: Right;   CYSTOSCOPY WITH INSERTION OF UROLIFT N/A 02/12/2018   Procedure: CYSTOSCOPY WITH INSERTION OF UROLIFT;  Surgeon: Abbie Sons, MD;  Location: [facility];  Service: Urology;  Laterality: N/A;   CYSTOSCOPY WITH LITHOLAPAXY N/A 02/12/2018   Procedure: CYSTOSCOPY WITH LITHOLAPAXY;  Surgeon: Abbie Sons, MD;  Location: [facility];  Service: Urology;  Laterality: N/A;   CYSTOSCOPY WITH LITHOLAPAXY N/A 04/10/2020   Procedure: CYSTOSCOPY WITH LITHOLAPAXY;   Surgeon: Abbie Sons, MD;  Location: [facility];  Service: Urology;  Laterality: N/A;   CYSTOSCOPY/URETEROSCOPY/HOLMIUM LASER/STENT PLACEMENT Right 04/10/2020   Procedure: CYSTOSCOPY/URETEROSCOPY/HOLMIUM LASER/STENT PLACEMENT;  Surgeon: Abbie Sons, MD;  Location: [facility];  Service: Urology;  Laterality: Right;   CYSTOSCOPY/URETEROSCOPY/HOLMIUM LASER/STENT PLACEMENT Left 07/15/2022   Procedure: CYSTOSCOPY/URETEROSCOPY/HOLMIUM LASER/STENT EXCHANGE;  Surgeon: Abbie Sons, MD;  Location: [facility];  Service: Urology;  Laterality: Left;   HOLMIUM LASER APPLICATION N/A 0000000   Procedure: HOLMIUM LASER APPLICATION;  Surgeon: Abbie Sons, MD;  Location: [facility];  Service: Urology;  Laterality: N/A;   LITHOTRIPSY      Home Medications:  Allergies as of 09/24/2022       Reactions   Dilaudid [hydromorphone Hcl] Anaphylaxis        Medication List        Accurate as of September 24, 2022  1:44 PM. If you have any questions, ask your nurse or doctor.          aspirin EC 81 MG tablet Take 81 mg by mouth daily.   atorvastatin 80 MG tablet Commonly known as: LIPITOR Take 80 mg by mouth daily.   citalopram 20 MG tablet Commonly known as: CELEXA Take 20 mg by mouth daily.   Gemtesa 75 MG Tabs Generic drug: Vibegron Take 1 tablet (75 mg total) by mouth daily.   tamsulosin 0.4 MG Caps capsule Commonly known as: FLOMAX Take 1 capsule (0.4 mg total) by mouth daily.  Allergies:  Allergies  Allergen Reactions   Dilaudid [Hydromorphone Hcl] Anaphylaxis    Family History: No family history on file.  Social History:   reports that he has never smoked. He has never used smokeless tobacco. He reports that he does not drink alcohol and does not use drugs.  Physical Exam: BP 128/74   Pulse 80   Ht 5\' 8"  (1.727 m)   Wt 168 lb (76.2 kg)   BMI 25.54 kg/m   Constitutional:  Alert and oriented, no acute distress, nontoxic appearing HEENT: [place],  AT Cardiovascular: No clubbing, cyanosis, or edema Respiratory: Normal respiratory effort, no increased work of breathing Skin: No rashes, bruises or suspicious lesions Neurologic: Grossly intact, no focal deficits, moving all 4 extremities Psychiatric: Normal mood and affect  Laboratory Data: Results for orders placed or performed in visit on 09/24/22  Bladder Scan (Post Void Residual) in office  Result Value Ref Range   Scan Result 32    Assessment & Plan:   1. Urinary frequency Significantly improved on Gemtesa and Flomax, though not resolved.  Overall he is pleased, but he would like to stop Flomax due to retrograde ejaculation.  I gave him an additional 6 weeks of Gemtesa samples today and if his symptoms have resolved by the time he completes them, okay to stop the medication entirely. - Bladder Scan (Post Void Residual) in office  2. History of nephrolithiasis Oxalate list provided today.  Encouraged him to continue his healthy habits with reduction in dark sodas and increased p.o. hydration.  Return if symptoms worsen or fail to improve.  Debroah Loop, PA-C  [facility] [place] [address] [place], [place] [address] [phone]

## 2022-11-03 ENCOUNTER — Ambulatory Visit
Admission: RE | Admit: 2022-11-03 | Discharge: 2022-11-03 | Disposition: A | Discharge status: Home / Self Care | Payer: Medicare Other | Source: Ambulatory Visit | Attending: Urology | Admitting: Urology

## 2022-11-03 ENCOUNTER — Ambulatory Visit: Payer: Medicare Other | Admitting: Urology

## 2022-11-03 ENCOUNTER — Ambulatory Visit
Admission: RE | Admit: 2022-11-03 | Discharge: 2022-11-03 | Disposition: A | Discharge status: Home / Self Care | Payer: Medicare Other | Attending: Urology | Admitting: Urology

## 2022-11-03 ENCOUNTER — Encounter: Payer: Self-pay | Admitting: Urology

## 2022-11-03 VITALS — BP 126/71 | HR 50 | Ht 67.0 in | Wt 175.0 lb

## 2022-11-03 DIAGNOSIS — Z466 Encounter for fitting and adjustment of urinary device: Secondary | ICD-10-CM

## 2022-11-03 DIAGNOSIS — N401 Enlarged prostate with lower urinary tract symptoms: Secondary | ICD-10-CM | POA: Diagnosis not present

## 2022-11-03 DIAGNOSIS — Z87442 Personal history of urinary calculi: Secondary | ICD-10-CM | POA: Diagnosis not present

## 2022-11-03 MED ORDER — ALFUZOSIN HCL ER 10 MG PO TB24: 10.0000 mg | ORAL_TABLET | Freq: Every day | ORAL | 1 refills | Status: DC

## 2022-11-03 NOTE — Progress Notes (Signed)
I, Maysun L Gibbs,acting as a scribe for Thomas Altes, MD.,have documented all relevant documentation on the behalf of Thomas Altes, MD,as directed by  Thomas Altes, MD while in the presence of Thomas Altes, MD.  11/03/2022 9:27 AM   Thomas Olson [birth year] [id number]  Referring provider: Cain Sieve, MD [address] [place],  [place] [address]  Chief Complaint  Patient presents with   Nephrolithiasis   Urologic history: 1.  BPH with LUTS UroLift 02/2018 Cystolitholapaxy 03/2020 for encrusted proximal implants   2.  Recurrent stone disease Underwent a ureterscopic removal of a 10 mm left proximal ureteral calculus January 2024.  HPI: Thomas Olson is a 72 y.o. male here for 3 month follow-up visit.  Underwent a ureterscopic removal of a 10 mm left proximal ureteral calculus January 2024. I last saw him February 2024 for stent removal. He did see our PA in late February complaining of urinary frequency and urgency. Urinalysis was unremarkable. He had continued Tamsulosin and was given a trial of Gemtesa at a follow-up visit early April 2024. His voiding symptoms had improved on Tamsulosin and Gemtesa. He stopped the Tamsulosin secondary to ejaculatory dysfunction. He has discontinued Flomax and remains on Gemtesa samples.  Does still have some urgency, but tolerable.  No dysuria, gross hematuria No flank, abdominal, or pelvic pain.    PMH: Past Medical History:  Diagnosis Date   CVA (cerebral vascular accident) ([facility])    08/2015, no residuals   Depression    History of kidney stones    Kidney stones    Sleep apnea    no cpap     Surgical History: Past Surgical History:  Procedure Laterality Date   CYSTOSCOPY     CYSTOSCOPY W/ RETROGRADES Right 04/10/2020   Procedure: CYSTOSCOPY WITH RETROGRADE PYELOGRAM;  Surgeon: Thomas Altes, MD;  Location: [facility];  Service: Urology;  Laterality: Right;   CYSTOSCOPY WITH INSERTION OF  UROLIFT N/A 02/12/2018   Procedure: CYSTOSCOPY WITH INSERTION OF UROLIFT;  Surgeon: Thomas Altes, MD;  Location: [facility];  Service: Urology;  Laterality: N/A;   CYSTOSCOPY WITH LITHOLAPAXY N/A 02/12/2018   Procedure: CYSTOSCOPY WITH LITHOLAPAXY;  Surgeon: Thomas Altes, MD;  Location: [facility];  Service: Urology;  Laterality: N/A;   CYSTOSCOPY WITH LITHOLAPAXY N/A 04/10/2020   Procedure: CYSTOSCOPY WITH LITHOLAPAXY;  Surgeon: Thomas Altes, MD;  Location: [facility];  Service: Urology;  Laterality: N/A;   CYSTOSCOPY/URETEROSCOPY/HOLMIUM LASER/STENT PLACEMENT Right 04/10/2020   Procedure: CYSTOSCOPY/URETEROSCOPY/HOLMIUM LASER/STENT PLACEMENT;  Surgeon: Thomas Altes, MD;  Location: [facility];  Service: Urology;  Laterality: Right;   CYSTOSCOPY/URETEROSCOPY/HOLMIUM LASER/STENT PLACEMENT Left 07/15/2022   Procedure: CYSTOSCOPY/URETEROSCOPY/HOLMIUM LASER/STENT EXCHANGE;  Surgeon: Thomas Altes, MD;  Location: [facility];  Service: Urology;  Laterality: Left;   HOLMIUM LASER APPLICATION N/A 02/12/2018   Procedure: HOLMIUM LASER APPLICATION;  Surgeon: Thomas Altes, MD;  Location: [facility];  Service: Urology;  Laterality: N/A;   LITHOTRIPSY      Home Medications:  Allergies as of 11/03/2022       Reactions   Dilaudid [hydromorphone Hcl] Anaphylaxis        Medication List        Accurate as of Nov 03, 2022  9:27 AM. If you have any questions, ask your nurse or doctor.          STOP taking these medications    tamsulosin 0.4 MG Caps capsule Commonly known as: FLOMAX  Stopped by: Thomas Altes, MD       TAKE these medications    alfuzosin 10 MG 24 hr tablet Commonly known as: UROXATRAL Take 1 tablet (10 mg total) by mouth daily with breakfast. Started by: Thomas Altes, MD   aspirin EC 81 MG tablet Take 81 mg by mouth daily.   atorvastatin 80 MG tablet Commonly known as: LIPITOR Take 80 mg by mouth daily.   citalopram 20 MG tablet Commonly known as:  CELEXA Take 20 mg by mouth daily.   Gemtesa 75 MG Tabs Generic drug: Vibegron Take 1 tablet (75 mg total) by mouth daily.        Allergies:  Allergies  Allergen Reactions   Dilaudid [Hydromorphone Hcl] Anaphylaxis    Social History:  reports that he has never smoked. He has never used smokeless tobacco. He reports that he does not drink alcohol and does not use drugs.   Physical Exam: BP 126/71   Pulse (!) 50   Ht 5\' 7"  (1.702 m)   Wt 175 lb (79.4 kg)   BMI 27.41 kg/m   Constitutional:  Alert and oriented, No acute distress. HEENT: [place] AT Respiratory: Normal respiratory effort, no increased work of breathing. Psychiatric: Normal mood and affect.   Pertinent Imaging: KUB performed this morning was personally reviewed and interpreted. No calcifications. Suspicious for urinary tract stones are identified. UroLift implants noted on KUB.   Assessment & Plan:    1. Personal history urinary calculi Negative KUB today.   2. BPH with LUTS  He currently is taking Gemtesa samples and is going to complete them in approximately 1 week. Rx Alfuzosin 10 mg daily, sent to pharmacy. He will call if he has worsening symptoms after completing Gemtesa.  1 year follow-up with KUB Call earlier for any worsening urinary symptoms or renal colic.  I have reviewed the above documentation for accuracy and completeness, and I agree with the above.   Thomas Altes, MD  [facility] [address] [place], [place] [address] [phone]

## 2023-10-27 ENCOUNTER — Other Ambulatory Visit: Payer: Self-pay | Admitting: *Deleted

## 2023-10-27 DIAGNOSIS — N201 Calculus of ureter: Secondary | ICD-10-CM

## 2023-11-04 ENCOUNTER — Ambulatory Visit
Admission: RE | Admit: 2023-11-04 | Discharge: 2023-11-04 | Disposition: A | Discharge status: Home / Self Care | Attending: Urology | Admitting: Urology

## 2023-11-04 ENCOUNTER — Encounter: Payer: Self-pay | Admitting: Urology

## 2023-11-04 ENCOUNTER — Ambulatory Visit: Payer: Self-pay | Admitting: Urology

## 2023-11-04 ENCOUNTER — Ambulatory Visit
Admission: RE | Admit: 2023-11-04 | Discharge: 2023-11-04 | Disposition: A | Discharge status: Home / Self Care | Source: Ambulatory Visit | Attending: Urology | Admitting: Urology

## 2023-11-04 VITALS — BP 133/68 | HR 64 | Ht 67.0 in | Wt 180.0 lb

## 2023-11-04 DIAGNOSIS — N201 Calculus of ureter: Secondary | ICD-10-CM | POA: Insufficient documentation

## 2023-11-04 DIAGNOSIS — N401 Enlarged prostate with lower urinary tract symptoms: Secondary | ICD-10-CM

## 2023-11-04 DIAGNOSIS — Z87442 Personal history of urinary calculi: Secondary | ICD-10-CM | POA: Diagnosis not present

## 2023-11-04 MED ORDER — ALFUZOSIN HCL ER 10 MG PO TB24: 10.0000 mg | ORAL_TABLET | Freq: Every day | ORAL | 3 refills | Status: DC

## 2023-11-04 NOTE — Progress Notes (Signed)
 I, Maysun Jamey Mccallum, acting as a Neurosurgeon for Geraline Knapp, MD., have documented all relevant documentation on the behalf of Geraline Knapp, MD, as directed by Geraline Knapp, MD while in the presence of Geraline Knapp, MD.  Discussed the use of AI scribe software for clinical note transcription with the patient, who gave verbal consent to proceed.   11/04/2023 3:03 PM   Thomas Olson [birth year] [id number]  Referring provider: Rosary Como, MD [address] [place],  [place] [address]  Chief Complaint  Patient presents with   Nephrolithiasis    HPI: Thomas Olson is a 73 y.o. male presents for annual follow-up.  No problems since last year's visit.  No recurrent stone symptoms, hematuria, or abdominal/pelvic pain.  Currently taking alfuzosin  and has noted significant improvement in lower urinary tract symptoms.  No longer taking Gemtesa .  Continues to experience nocturia, getting up once or twice during the night.    PMH: Past Medical History:  Diagnosis Date   CVA (cerebral vascular accident) (HCC)    08/2015, no residuals   Depression    History of kidney stones    Kidney stones    Sleep apnea    no cpap     Surgical History: Past Surgical History:  Procedure Laterality Date   CYSTOSCOPY     CYSTOSCOPY W/ RETROGRADES Right 04/10/2020   Procedure: CYSTOSCOPY WITH RETROGRADE PYELOGRAM;  Surgeon: Geraline Knapp, MD;  Location: [facility];  Service: Urology;  Laterality: Right;   CYSTOSCOPY WITH INSERTION OF UROLIFT N/A 02/12/2018   Procedure: CYSTOSCOPY WITH INSERTION OF UROLIFT;  Surgeon: Geraline Knapp, MD;  Location: [facility];  Service: Urology;  Laterality: N/A;   CYSTOSCOPY WITH LITHOLAPAXY N/A 02/12/2018   Procedure: CYSTOSCOPY WITH LITHOLAPAXY;  Surgeon: Geraline Knapp, MD;  Location: [facility];  Service: Urology;  Laterality: N/A;   CYSTOSCOPY WITH LITHOLAPAXY N/A 04/10/2020   Procedure: CYSTOSCOPY WITH LITHOLAPAXY;  Surgeon:  Geraline Knapp, MD;  Location: [facility];  Service: Urology;  Laterality: N/A;   CYSTOSCOPY/URETEROSCOPY/HOLMIUM LASER/STENT PLACEMENT Right 04/10/2020   Procedure: CYSTOSCOPY/URETEROSCOPY/HOLMIUM LASER/STENT PLACEMENT;  Surgeon: Geraline Knapp, MD;  Location: [facility];  Service: Urology;  Laterality: Right;   CYSTOSCOPY/URETEROSCOPY/HOLMIUM LASER/STENT PLACEMENT Left 07/15/2022   Procedure: CYSTOSCOPY/URETEROSCOPY/HOLMIUM LASER/STENT EXCHANGE;  Surgeon: Geraline Knapp, MD;  Location: [facility];  Service: Urology;  Laterality: Left;   HOLMIUM LASER APPLICATION N/A 02/12/2018   Procedure: HOLMIUM LASER APPLICATION;  Surgeon: Geraline Knapp, MD;  Location: [facility];  Service: Urology;  Laterality: N/A;   LITHOTRIPSY      Home Medications:  Allergies as of 11/04/2023       Reactions   Dilaudid [hydromorphone Hcl] Anaphylaxis        Medication List        Accurate as of Nov 04, 2023  3:03 PM. If you have any questions, ask your nurse or doctor.          alfuzosin  10 MG 24 hr tablet Commonly known as: UROXATRAL  Take 1 tablet (10 mg total) by mouth daily with breakfast.   aspirin EC 81 MG tablet Take 81 mg by mouth daily.   atorvastatin 80 MG tablet Commonly known as: LIPITOR Take 80 mg by mouth daily.   citalopram 20 MG tablet Commonly known as: CELEXA Take 20 mg by mouth daily.   Gemtesa  75 MG Tabs Generic drug: Vibegron  Take 1 tablet (75 mg total) by mouth daily.  Allergies:  Allergies  Allergen Reactions   Dilaudid [Hydromorphone Hcl] Anaphylaxis   Social History:  reports that he has never smoked. He has never used smokeless tobacco. He reports that he does not drink alcohol and does not use drugs.   Physical Exam: BP 133/68   Pulse 64   Ht 5\' 7"  (1.702 m)   Wt 180 lb (81.6 kg)   BMI 28.19 kg/m   Constitutional:  Alert and oriented, No acute distress. HEENT: [place] AT Respiratory: Normal respiratory effort, no increased work of  breathing. Psychiatric: Normal mood and affect.  Pertinent Imaging KUB performed this morning, personally reviewed and interpreted, showing no calcification suspicious for urinary tract stones. Pelvic phleboliths are present, and increased density just superior to the right iliac crest, likely bowel contents.  Assessment & Plan:    1. Personal history of urinary calculi No evidence of recurrent stones seen on today's KUB. Continue monitoring for any symptoms suggestive of stone recurrence.  2. BPH with LUTS  Patient reports significant improvement in symptoms with alfuzosin . Refill prescription for alfuzosin  as requested. Patient is no longer taking Gemtesa . Continue annual follow-up.  I have reviewed the above documentation for accuracy and completeness, and I agree with the above.   Geraline Knapp, MD  [facility] [address], [address] [place], [place] [address] [phone]

## 2024-06-07 ENCOUNTER — Ambulatory Visit: Admitting: Urology

## 2024-06-27 ENCOUNTER — Encounter: Payer: Self-pay | Admitting: Urology

## 2024-06-27 ENCOUNTER — Ambulatory Visit: Admitting: Urology

## 2024-06-27 VITALS — BP 139/74 | HR 61 | Ht 67.0 in | Wt 180.0 lb

## 2024-06-27 DIAGNOSIS — N401 Enlarged prostate with lower urinary tract symptoms: Secondary | ICD-10-CM

## 2024-06-27 MED ORDER — SILODOSIN 8 MG PO CAPS: 8.0000 mg | ORAL_CAPSULE | Freq: Every day | ORAL | 3 refills | Status: AC

## 2024-06-27 NOTE — Progress Notes (Signed)
 "  06/27/2024 12:55 PM   Thomas Olson 03/25/1951 [id number]  Referring provider: Lajoyce Bruckner, MD [address] [place],  [place] [address]  Chief Complaint  Patient presents with   Benign Prostatic Hypertrophy   Urologic history: 1.  BPH with LUTS UroLift 02/2018 Cystolitholapaxy 03/2020 for encrusted proximal implants   2.  Recurrent stone disease Underwent a ureterscopic removal of a 10 mm left proximal ureteral calculus January 2024.   HPI: Thomas Olson is a 74 y.o. male who called for a follow-up visit.  At his last visit 11/04/2023 he was doing well on alfuzosin  with significant improvement in lower urinary tract symptoms and had discontinued Gemtesa  Over the last few months he has experienced worsening lower urinary tract symptoms including nocturia x 4, intermittent urinary stream, frequency and a weak urinary stream IPSS today 24/35 Denies dysuria, gross hematuria No flank, abdominal or pelvic pain   PMH: Past Medical History:  Diagnosis Date   CVA (cerebral vascular accident) (HCC)    08/2015, no residuals   Depression    History of kidney stones    Kidney stones    Sleep apnea    no cpap     Surgical History: Past Surgical History:  Procedure Laterality Date   CYSTOSCOPY     CYSTOSCOPY W/ RETROGRADES Right 04/10/2020   Procedure: CYSTOSCOPY WITH RETROGRADE PYELOGRAM;  Surgeon: Twylla Glendia BROCKS, MD;  Location: [facility];  Service: Urology;  Laterality: Right;   CYSTOSCOPY WITH INSERTION OF UROLIFT N/A 02/12/2018   Procedure: CYSTOSCOPY WITH INSERTION OF UROLIFT;  Surgeon: Twylla Glendia BROCKS, MD;  Location: [facility];  Service: Urology;  Laterality: N/A;   CYSTOSCOPY WITH LITHOLAPAXY N/A 02/12/2018   Procedure: CYSTOSCOPY WITH LITHOLAPAXY;  Surgeon: Twylla Glendia BROCKS, MD;  Location: [facility];  Service: Urology;  Laterality: N/A;   CYSTOSCOPY WITH LITHOLAPAXY N/A 04/10/2020   Procedure: CYSTOSCOPY WITH LITHOLAPAXY;  Surgeon: Twylla Glendia BROCKS,  MD;  Location: [facility];  Service: Urology;  Laterality: N/A;   CYSTOSCOPY/URETEROSCOPY/HOLMIUM LASER/STENT PLACEMENT Right 04/10/2020   Procedure: CYSTOSCOPY/URETEROSCOPY/HOLMIUM LASER/STENT PLACEMENT;  Surgeon: Twylla Glendia BROCKS, MD;  Location: [facility];  Service: Urology;  Laterality: Right;   CYSTOSCOPY/URETEROSCOPY/HOLMIUM LASER/STENT PLACEMENT Left 07/15/2022   Procedure: CYSTOSCOPY/URETEROSCOPY/HOLMIUM LASER/STENT EXCHANGE;  Surgeon: Twylla Glendia BROCKS, MD;  Location: [facility];  Service: Urology;  Laterality: Left;   HOLMIUM LASER APPLICATION N/A 02/12/2018   Procedure: HOLMIUM LASER APPLICATION;  Surgeon: Twylla Glendia BROCKS, MD;  Location: [facility];  Service: Urology;  Laterality: N/A;   LITHOTRIPSY      Home Medications:  Allergies as of 06/27/2024       Reactions   Dilaudid [hydromorphone Hcl] Anaphylaxis        Medication List        Accurate as of June 27, 2024 12:55 PM. If you have any questions, ask your nurse or doctor.          STOP taking these medications    alfuzosin  10 MG 24 hr tablet Commonly known as: UROXATRAL  Stopped by: Glendia Twylla, MD   Gemtesa  75 MG Tabs Generic drug: Vibegron  Stopped by: Glendia Twylla, MD       TAKE these medications    aspirin EC 81 MG tablet Take 81 mg by mouth daily.   atorvastatin 80 MG tablet Commonly known as: LIPITOR Take 80 mg by mouth daily.   citalopram 20 MG tablet Commonly known as: CELEXA Take 20 mg by mouth daily.   silodosin  8 MG Caps  capsule Commonly known as: RAPAFLO  Take 1 capsule (8 mg total) by mouth daily with breakfast. Started by: Glendia Barba, MD        Allergies: Allergies[1]  Family History: History reviewed. No pertinent family history.  Social History:  reports that he has never smoked. He has never used smokeless tobacco. He reports that he does not drink alcohol and does not use drugs.   Physical Exam: BP 139/74   Pulse 61   Ht 5' 7 (1.702 m)   Wt 180 lb (81.6 kg)   BMI  28.19 kg/m   Constitutional:  Alert, No acute distress. HEENT: [place] AT Respiratory: Normal respiratory effort, no increased work of breathing. Psychiatric: Normal mood and affect.   Assessment & Plan:    1. Benign prostatic hyperplasia with LUTS Worsening obstructive/storage related voiding symptoms Discontinue alfuzosin  Trial silodosin  8 mg daily Follow-up 1 month and cystoscopy at that visit if symptoms have not significantly improved   Glendia JAYSON Barba, MD  [facility] [address], [address] [place], [place] [address] [phone]     [1]  Allergies Allergen Reactions   Dilaudid [Hydromorphone Hcl] Anaphylaxis   "

## 2024-06-27 NOTE — Patient Instructions (Signed)

## 2024-06-28 ENCOUNTER — Encounter: Payer: Self-pay | Admitting: Urology

## 2024-08-02 ENCOUNTER — Other Ambulatory Visit: Admitting: Urology

## 2024-11-03 ENCOUNTER — Ambulatory Visit: Admitting: Urology
